# Patient Record
Sex: Female | Born: 1960 | Race: White | Hispanic: No | Marital: Married | Smoking: Never smoker
Health system: Southern US, Community
[De-identification: ages and names within clinical notes are randomized; demographics above are authoritative.]

## PROBLEM LIST (undated history)

## (undated) DIAGNOSIS — K222 Esophageal obstruction: Secondary | ICD-10-CM

## (undated) DIAGNOSIS — G43009 Migraine without aura, not intractable, without status migrainosus: Secondary | ICD-10-CM

## (undated) DIAGNOSIS — K219 Gastro-esophageal reflux disease without esophagitis: Secondary | ICD-10-CM

## (undated) DIAGNOSIS — I10 Essential (primary) hypertension: Secondary | ICD-10-CM

## (undated) DIAGNOSIS — K200 Eosinophilic esophagitis: Principal | ICD-10-CM

## (undated) DIAGNOSIS — I351 Nonrheumatic aortic (valve) insufficiency: Secondary | ICD-10-CM

## (undated) DIAGNOSIS — Z86018 Personal history of other benign neoplasm: Secondary | ICD-10-CM

## (undated) DIAGNOSIS — L309 Dermatitis, unspecified: Secondary | ICD-10-CM

## (undated) DIAGNOSIS — J45990 Exercise induced bronchospasm: Secondary | ICD-10-CM

## (undated) HISTORY — DX: Dermatitis, unspecified: L30.9

## (undated) HISTORY — PX: REDUCTION MAMMAPLASTY: SUR839

## (undated) HISTORY — DX: Nonrheumatic aortic (valve) insufficiency: I35.1

## (undated) HISTORY — DX: Migraine without aura, not intractable, without status migrainosus: G43.009

## (undated) HISTORY — DX: Eosinophilic esophagitis: K20.0

## (undated) HISTORY — DX: Gastro-esophageal reflux disease without esophagitis: K21.9

## (undated) HISTORY — DX: Esophageal obstruction: K22.2

## (undated) HISTORY — DX: Essential (primary) hypertension: I10

## (undated) HISTORY — DX: Exercise induced bronchospasm: J45.990

## (undated) HISTORY — DX: Personal history of other benign neoplasm: Z86.018

## (undated) HISTORY — PX: BREAST REDUCTION SURGERY: SHX8

## (undated) HISTORY — PX: OTHER SURGICAL HISTORY: SHX169

## (undated) HISTORY — PX: TONSILLECTOMY: SUR1361

---

## 1993-07-15 HISTORY — PX: MYOMECTOMY: SHX85

## 1998-05-17 ENCOUNTER — Encounter: Payer: Self-pay | Admitting: Internal Medicine

## 1998-05-17 ENCOUNTER — Ambulatory Visit (HOSPITAL_COMMUNITY): Admission: RE | Admit: 1998-05-17 | Discharge: 1998-05-17 | Payer: Self-pay | Admitting: Internal Medicine

## 1998-07-15 HISTORY — PX: ABDOMINOPLASTY: SUR9

## 2000-11-07 ENCOUNTER — Other Ambulatory Visit: Admission: RE | Admit: 2000-11-07 | Discharge: 2000-11-07 | Payer: Self-pay | Admitting: Obstetrics & Gynecology

## 2002-07-02 ENCOUNTER — Ambulatory Visit (HOSPITAL_COMMUNITY): Admission: RE | Admit: 2002-07-02 | Discharge: 2002-07-02 | Payer: Self-pay | Admitting: Internal Medicine

## 2002-07-02 ENCOUNTER — Encounter: Payer: Self-pay | Admitting: Internal Medicine

## 2002-12-20 ENCOUNTER — Other Ambulatory Visit: Admission: RE | Admit: 2002-12-20 | Discharge: 2002-12-20 | Payer: Self-pay | Admitting: Obstetrics & Gynecology

## 2003-12-14 ENCOUNTER — Ambulatory Visit (HOSPITAL_COMMUNITY): Admission: RE | Admit: 2003-12-14 | Discharge: 2003-12-14 | Payer: Self-pay | Admitting: Obstetrics and Gynecology

## 2004-01-06 ENCOUNTER — Ambulatory Visit (HOSPITAL_COMMUNITY): Admission: RE | Admit: 2004-01-06 | Discharge: 2004-01-06 | Payer: Self-pay | Admitting: Obstetrics and Gynecology

## 2004-01-06 ENCOUNTER — Ambulatory Visit (HOSPITAL_BASED_OUTPATIENT_CLINIC_OR_DEPARTMENT_OTHER): Admission: RE | Admit: 2004-01-06 | Discharge: 2004-01-06 | Payer: Self-pay | Admitting: Obstetrics and Gynecology

## 2004-01-06 HISTORY — PX: ABLATION: SHX5711

## 2004-03-28 ENCOUNTER — Other Ambulatory Visit: Admission: RE | Admit: 2004-03-28 | Discharge: 2004-03-28 | Payer: Self-pay | Admitting: Obstetrics and Gynecology

## 2005-05-15 DIAGNOSIS — G43009 Migraine without aura, not intractable, without status migrainosus: Secondary | ICD-10-CM

## 2005-05-15 HISTORY — DX: Migraine without aura, not intractable, without status migrainosus: G43.009

## 2005-05-28 ENCOUNTER — Other Ambulatory Visit: Admission: RE | Admit: 2005-05-28 | Discharge: 2005-05-28 | Payer: Self-pay | Admitting: Obstetrics and Gynecology

## 2005-06-25 ENCOUNTER — Ambulatory Visit (HOSPITAL_COMMUNITY): Admission: RE | Admit: 2005-06-25 | Discharge: 2005-06-25 | Payer: Self-pay | Admitting: Obstetrics and Gynecology

## 2006-10-20 ENCOUNTER — Ambulatory Visit: Payer: Self-pay | Admitting: Internal Medicine

## 2006-11-19 ENCOUNTER — Ambulatory Visit: Payer: Self-pay | Admitting: Internal Medicine

## 2006-11-19 LAB — CONVERTED CEMR LAB
AST: 13 units/L (ref 0–37)
Alkaline Phosphatase: 33 units/L — ABNORMAL LOW (ref 39–117)
Calcium: 9.9 mg/dL (ref 8.4–10.5)
Chloride: 108 meq/L (ref 96–112)
Cholesterol: 152 mg/dL (ref 0–200)
Creatinine, Ser: 0.7 mg/dL (ref 0.4–1.2)
GFR calc non Af Amer: 97 mL/min
HCT: 37.8 % (ref 36.0–46.0)
Hemoglobin: 13.6 g/dL (ref 12.0–15.0)
LDL Cholesterol: 97 mg/dL (ref 0–99)
MCHC: 35.9 g/dL (ref 30.0–36.0)
MCV: 85.1 fL (ref 78.0–100.0)
Monocytes Absolute: 0.4 10*3/uL (ref 0.2–0.7)
Neutro Abs: 2.8 10*3/uL (ref 1.4–7.7)
Neutrophils Relative %: 54.7 % (ref 43.0–77.0)
RDW: 11.9 % (ref 11.5–14.6)
TSH: 1.81 microintl units/mL (ref 0.35–5.50)
Total Bilirubin: 0.8 mg/dL (ref 0.3–1.2)
Total CHOL/HDL Ratio: 3.8
Triglycerides: 76 mg/dL (ref 0–149)
VLDL: 15 mg/dL (ref 0–40)
WBC: 5.1 10*3/uL (ref 4.5–10.5)

## 2006-12-04 ENCOUNTER — Ambulatory Visit: Payer: Self-pay | Admitting: Internal Medicine

## 2006-12-04 ENCOUNTER — Encounter: Payer: Self-pay | Admitting: Internal Medicine

## 2007-04-29 ENCOUNTER — Ambulatory Visit: Payer: Self-pay | Admitting: Critical Care Medicine

## 2007-04-29 ENCOUNTER — Ambulatory Visit: Payer: Self-pay | Admitting: Specialist

## 2007-05-27 ENCOUNTER — Ambulatory Visit: Payer: Self-pay | Admitting: Pulmonary Disease

## 2007-07-06 ENCOUNTER — Ambulatory Visit: Payer: Self-pay | Admitting: Critical Care Medicine

## 2007-08-01 ENCOUNTER — Emergency Department (HOSPITAL_COMMUNITY): Admission: EM | Admit: 2007-08-01 | Discharge: 2007-08-01 | Payer: Self-pay | Admitting: Emergency Medicine

## 2007-08-01 ENCOUNTER — Encounter: Payer: Self-pay | Admitting: Gastroenterology

## 2007-08-05 ENCOUNTER — Ambulatory Visit: Payer: Self-pay | Admitting: Gastroenterology

## 2007-08-14 ENCOUNTER — Ambulatory Visit: Payer: Self-pay | Admitting: Gastroenterology

## 2007-08-21 ENCOUNTER — Encounter: Payer: Self-pay | Admitting: Internal Medicine

## 2007-08-21 ENCOUNTER — Ambulatory Visit: Payer: Self-pay | Admitting: Gastroenterology

## 2007-08-21 ENCOUNTER — Encounter: Payer: Self-pay | Admitting: Gastroenterology

## 2007-08-26 ENCOUNTER — Encounter: Payer: Self-pay | Admitting: Internal Medicine

## 2007-09-01 ENCOUNTER — Other Ambulatory Visit: Admission: RE | Admit: 2007-09-01 | Discharge: 2007-09-01 | Payer: Self-pay | Admitting: Obstetrics and Gynecology

## 2007-09-09 ENCOUNTER — Ambulatory Visit: Payer: Self-pay | Admitting: Internal Medicine

## 2007-09-09 DIAGNOSIS — I10 Essential (primary) hypertension: Secondary | ICD-10-CM | POA: Insufficient documentation

## 2007-09-09 DIAGNOSIS — N318 Other neuromuscular dysfunction of bladder: Secondary | ICD-10-CM

## 2007-09-09 DIAGNOSIS — G43909 Migraine, unspecified, not intractable, without status migrainosus: Secondary | ICD-10-CM

## 2007-09-10 ENCOUNTER — Telehealth: Payer: Self-pay | Admitting: Internal Medicine

## 2007-10-05 ENCOUNTER — Ambulatory Visit: Payer: Self-pay | Admitting: Gastroenterology

## 2007-12-08 ENCOUNTER — Ambulatory Visit: Payer: Self-pay | Admitting: Internal Medicine

## 2007-12-08 LAB — CONVERTED CEMR LAB
Glucose, Urine, Semiquant: NEGATIVE
Specific Gravity, Urine: 1.02

## 2007-12-10 LAB — CONVERTED CEMR LAB
Alkaline Phosphatase: 39 units/L (ref 39–117)
Basophils Absolute: 0 10*3/uL (ref 0.0–0.1)
Basophils Relative: 0.7 % (ref 0.0–1.0)
Bilirubin, Direct: 0.1 mg/dL (ref 0.0–0.3)
Calcium: 9.4 mg/dL (ref 8.4–10.5)
Cholesterol: 168 mg/dL (ref 0–200)
Eosinophils Absolute: 0.5 10*3/uL (ref 0.0–0.7)
GFR calc Af Amer: 116 mL/min
GFR calc non Af Amer: 96 mL/min
Hemoglobin: 13.7 g/dL (ref 12.0–15.0)
LDL Cholesterol: 120 mg/dL — ABNORMAL HIGH (ref 0–99)
Lymphocytes Relative: 27.1 % (ref 12.0–46.0)
Monocytes Relative: 9.4 % (ref 3.0–12.0)
Neutro Abs: 2.9 10*3/uL (ref 1.4–7.7)
Total Bilirubin: 0.9 mg/dL (ref 0.3–1.2)
VLDL: 10 mg/dL (ref 0–40)

## 2007-12-15 ENCOUNTER — Ambulatory Visit: Payer: Self-pay | Admitting: Internal Medicine

## 2008-01-26 ENCOUNTER — Ambulatory Visit: Payer: Self-pay | Admitting: Internal Medicine

## 2008-01-26 ENCOUNTER — Ambulatory Visit: Payer: Self-pay | Admitting: Specialist

## 2008-02-29 ENCOUNTER — Encounter: Payer: Self-pay | Admitting: Internal Medicine

## 2008-06-23 ENCOUNTER — Telehealth: Payer: Self-pay | Admitting: Family Medicine

## 2008-07-18 ENCOUNTER — Telehealth: Payer: Self-pay | Admitting: Internal Medicine

## 2008-07-22 ENCOUNTER — Telehealth: Payer: Self-pay | Admitting: Gastroenterology

## 2008-07-22 ENCOUNTER — Encounter: Payer: Self-pay | Admitting: Gastroenterology

## 2008-07-27 ENCOUNTER — Telehealth: Payer: Self-pay | Admitting: Internal Medicine

## 2008-08-05 ENCOUNTER — Ambulatory Visit: Payer: Self-pay | Admitting: Internal Medicine

## 2008-08-11 ENCOUNTER — Encounter: Payer: Self-pay | Admitting: Internal Medicine

## 2008-08-15 ENCOUNTER — Telehealth: Payer: Self-pay | Admitting: *Deleted

## 2008-08-15 ENCOUNTER — Telehealth: Payer: Self-pay | Admitting: Internal Medicine

## 2008-09-20 ENCOUNTER — Telehealth: Payer: Self-pay | Admitting: *Deleted

## 2008-09-20 ENCOUNTER — Other Ambulatory Visit: Admission: RE | Admit: 2008-09-20 | Discharge: 2008-09-20 | Payer: Self-pay | Admitting: Obstetrics and Gynecology

## 2008-09-30 ENCOUNTER — Ambulatory Visit (HOSPITAL_COMMUNITY): Admission: RE | Admit: 2008-09-30 | Discharge: 2008-09-30 | Payer: Self-pay | Admitting: Obstetrics and Gynecology

## 2008-12-19 ENCOUNTER — Ambulatory Visit: Payer: Self-pay | Admitting: Internal Medicine

## 2008-12-19 LAB — CONVERTED CEMR LAB
ALT: 13 units/L (ref 0–35)
Albumin: 4.1 g/dL (ref 3.5–5.2)
Alkaline Phosphatase: 42 units/L (ref 39–117)
Basophils Absolute: 0 10*3/uL (ref 0.0–0.1)
Bilirubin Urine: NEGATIVE
Creatinine, Ser: 0.6 mg/dL (ref 0.4–1.2)
GFR calc non Af Amer: 114.02 mL/min (ref >60)
Glucose, Urine, Semiquant: NEGATIVE
Ketones, urine, test strip: NEGATIVE
LDL Cholesterol: 130 mg/dL — ABNORMAL HIGH (ref 0–99)
Lymphocytes Relative: 26.3 % (ref 12.0–46.0)
Lymphs Abs: 1.4 10*3/uL (ref 0.7–4.0)
MCV: 84.1 fL (ref 78.0–100.0)
Monocytes Absolute: 0.4 10*3/uL (ref 0.1–1.0)
Monocytes Relative: 7.9 % (ref 3.0–12.0)
Nitrite: NEGATIVE
Platelets: 272 10*3/uL (ref 150.0–400.0)
RBC: 4.65 M/uL (ref 3.87–5.11)
Sodium: 142 meq/L (ref 135–145)
Specific Gravity, Urine: 1.02
Total Protein: 6.9 g/dL (ref 6.0–8.3)
Urobilinogen, UA: 0.2
pH: 6.5

## 2009-01-10 ENCOUNTER — Ambulatory Visit: Payer: Self-pay | Admitting: Internal Medicine

## 2009-01-10 LAB — CONVERTED CEMR LAB
Blood in Urine, dipstick: NEGATIVE
Glucose, Urine, Semiquant: NEGATIVE
Ketones, urine, test strip: NEGATIVE
pH: 6

## 2009-01-11 ENCOUNTER — Encounter: Payer: Self-pay | Admitting: Internal Medicine

## 2009-01-17 ENCOUNTER — Telehealth (INDEPENDENT_AMBULATORY_CARE_PROVIDER_SITE_OTHER): Payer: Self-pay | Admitting: *Deleted

## 2009-05-08 ENCOUNTER — Telehealth (INDEPENDENT_AMBULATORY_CARE_PROVIDER_SITE_OTHER): Payer: Self-pay | Admitting: *Deleted

## 2009-05-29 ENCOUNTER — Telehealth: Payer: Self-pay | Admitting: *Deleted

## 2009-06-05 ENCOUNTER — Ambulatory Visit: Payer: Self-pay | Admitting: Internal Medicine

## 2009-06-16 ENCOUNTER — Encounter: Payer: Self-pay | Admitting: Internal Medicine

## 2009-06-16 ENCOUNTER — Telehealth: Payer: Self-pay | Admitting: *Deleted

## 2009-06-28 ENCOUNTER — Telehealth: Payer: Self-pay | Admitting: *Deleted

## 2009-06-28 ENCOUNTER — Encounter (INDEPENDENT_AMBULATORY_CARE_PROVIDER_SITE_OTHER): Payer: Self-pay | Admitting: *Deleted

## 2009-07-06 ENCOUNTER — Encounter: Payer: Self-pay | Admitting: Internal Medicine

## 2009-07-17 ENCOUNTER — Telehealth: Payer: Self-pay | Admitting: *Deleted

## 2009-10-02 ENCOUNTER — Ambulatory Visit (HOSPITAL_COMMUNITY): Admission: RE | Admit: 2009-10-02 | Discharge: 2009-10-02 | Payer: Self-pay | Admitting: Obstetrics and Gynecology

## 2009-10-07 IMAGING — CR DG CHEST 2V
2 series · 2 of 2 positions shown · non-contrast
Comparison: 04/29/07

CLINICAL DATA: Question of foreign body in esophagus.
 CHEST ? 2 VIEW:

[w chest pa]
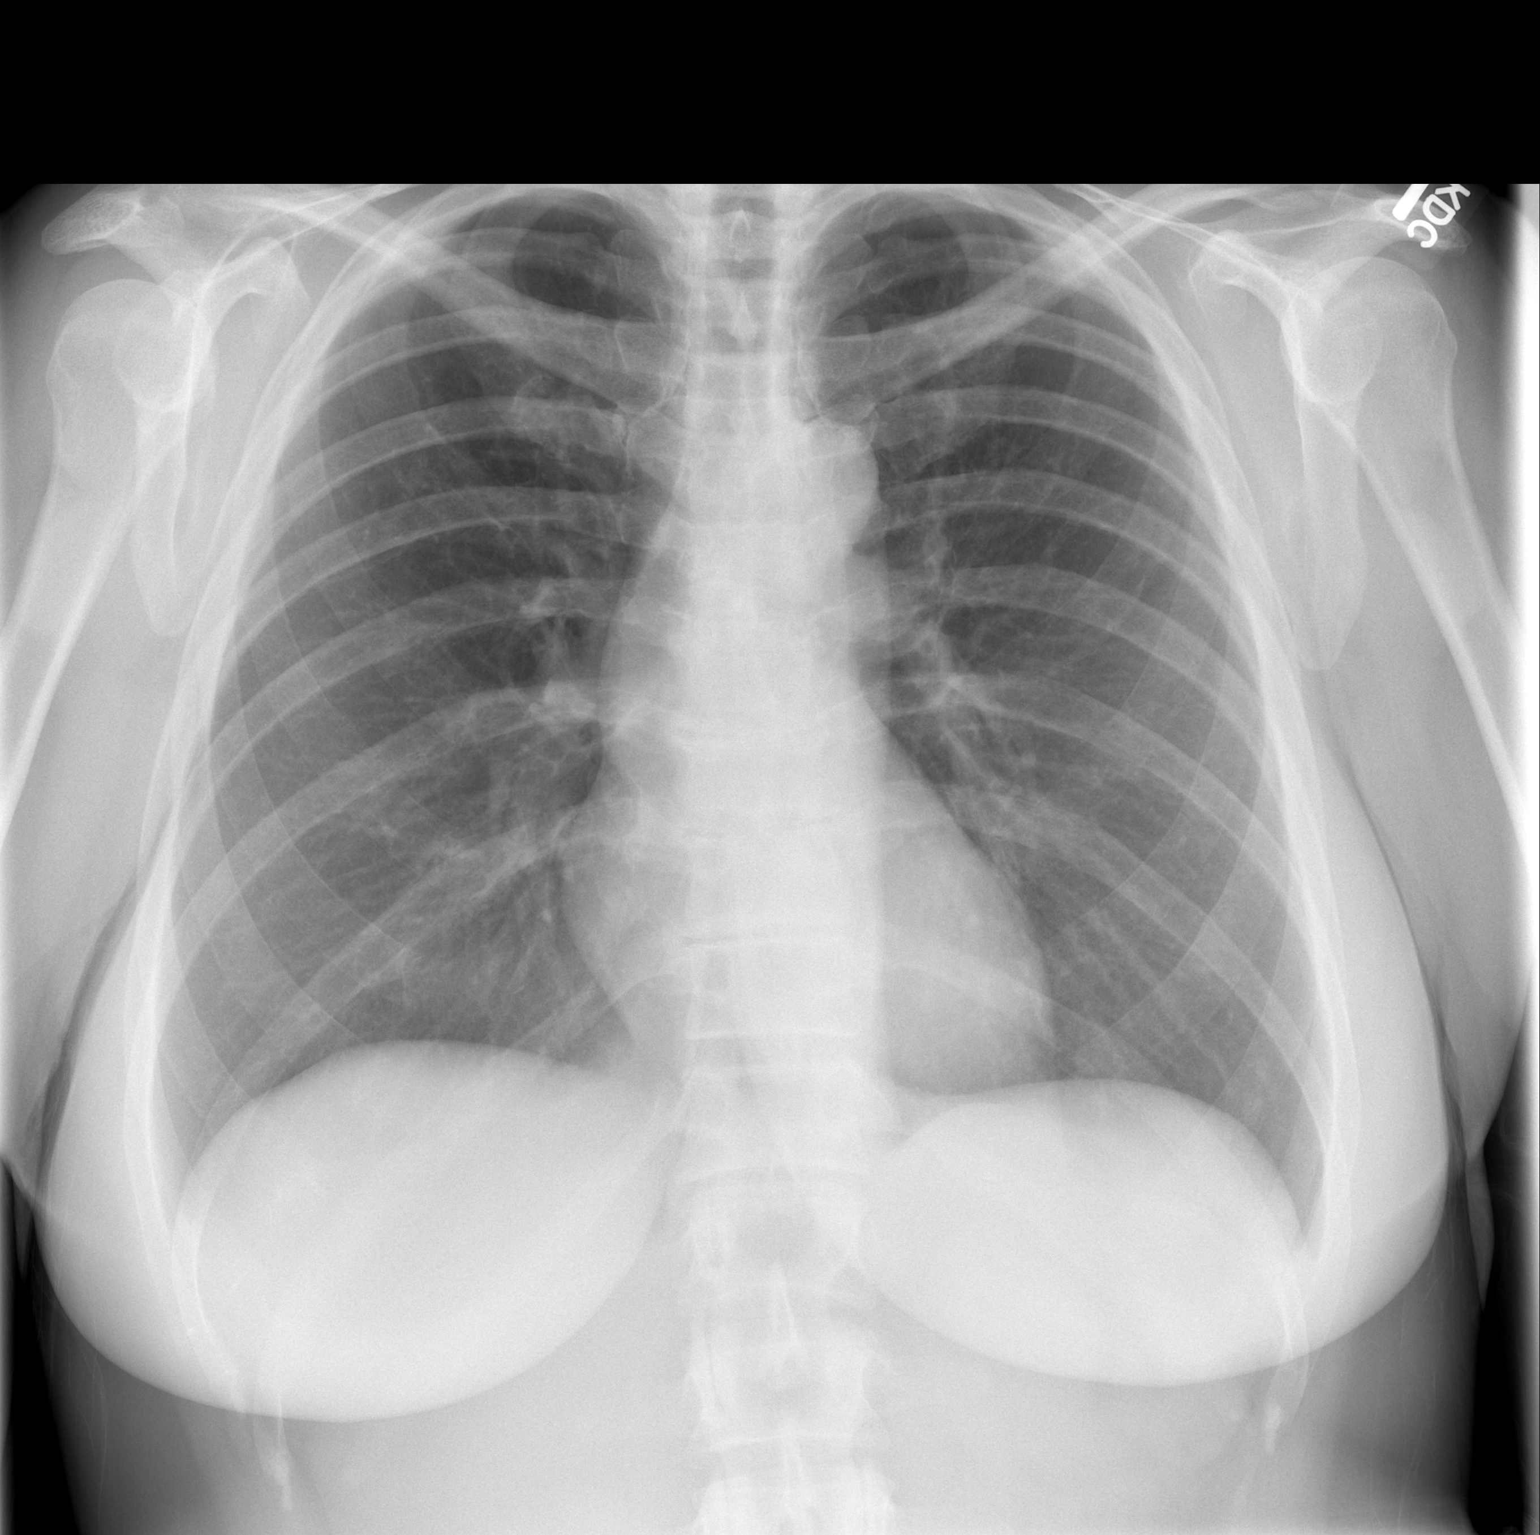

[w chest lat]
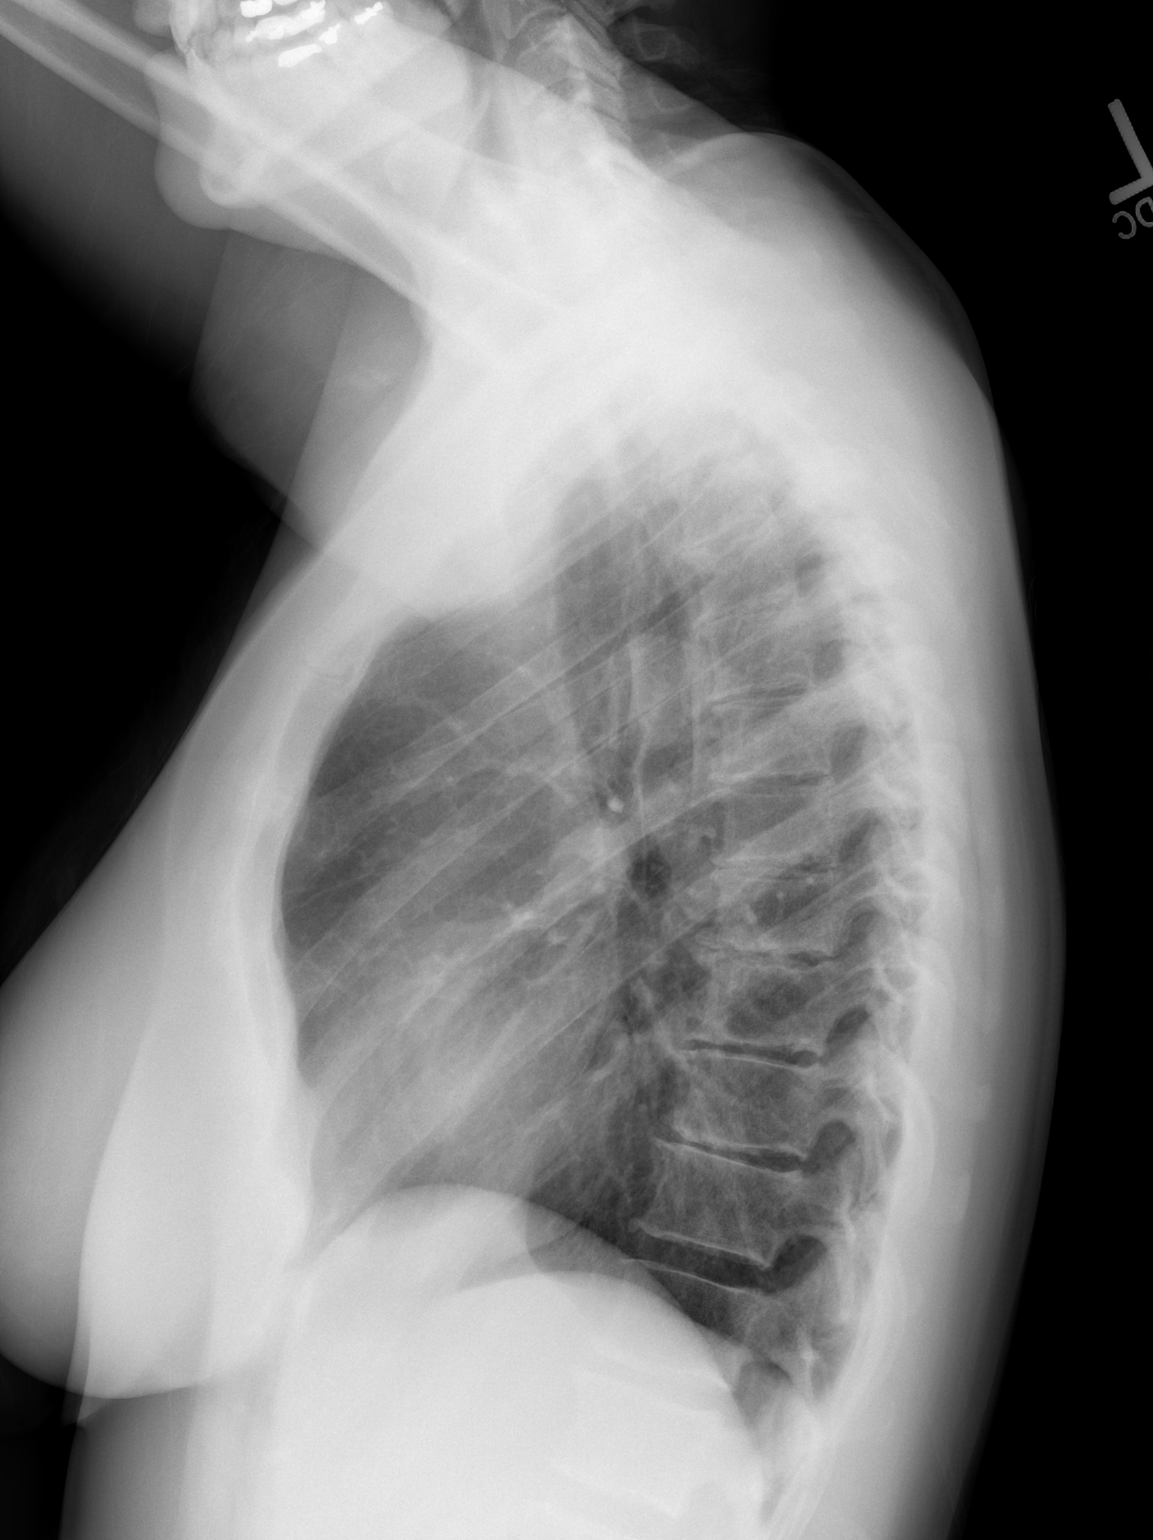

[2 of 2 positions shown; findings below may reference images not displayed]

FINDINGS: Two views of the chest show the lungs to be clear.  The heart is within normal limits in size.  No opaque foreign body is seen.
IMPRESSION: No active lung disease.  No opaque foreign body.

## 2010-05-14 ENCOUNTER — Ambulatory Visit: Payer: Self-pay | Admitting: Internal Medicine

## 2010-05-14 DIAGNOSIS — L301 Dyshidrosis [pompholyx]: Secondary | ICD-10-CM

## 2010-05-14 DIAGNOSIS — E669 Obesity, unspecified: Secondary | ICD-10-CM

## 2010-05-17 ENCOUNTER — Ambulatory Visit: Payer: Self-pay | Admitting: Internal Medicine

## 2010-05-17 LAB — CONVERTED CEMR LAB
Glucose, Urine, Semiquant: NEGATIVE
Nitrite: NEGATIVE
Protein, U semiquant: NEGATIVE
Specific Gravity, Urine: 1.025

## 2010-05-21 LAB — CONVERTED CEMR LAB
ALT: 14 units/L (ref 0–35)
AST: 18 units/L (ref 0–37)
Albumin: 4.2 g/dL (ref 3.5–5.2)
BUN: 16 mg/dL (ref 6–23)
Bilirubin, Direct: 0.1 mg/dL (ref 0.0–0.3)
CO2: 29 meq/L (ref 19–32)
Calcium: 9.8 mg/dL (ref 8.4–10.5)
Chloride: 106 meq/L (ref 96–112)
Cholesterol: 173 mg/dL (ref 0–200)
Creatinine, Ser: 0.8 mg/dL (ref 0.4–1.2)
GFR calc non Af Amer: 87.6 mL/min (ref >60)
Glucose, Bld: 82 mg/dL (ref 70–99)
HCT: 39.5 % (ref 36.0–46.0)
Lymphs Abs: 1.7 10*3/uL (ref 0.7–4.0)
MCV: 84.3 fL (ref 78.0–100.0)
Monocytes Absolute: 0.4 10*3/uL (ref 0.1–1.0)
Monocytes Relative: 7.7 % (ref 3.0–12.0)
Neutro Abs: 2.6 10*3/uL (ref 1.4–7.7)
Neutrophils Relative %: 50.3 % (ref 43.0–77.0)
Potassium: 4.3 meq/L (ref 3.5–5.1)
RDW: 13 % (ref 11.5–14.6)
Sodium: 140 meq/L (ref 135–145)
TSH: 1.91 microintl units/mL (ref 0.35–5.50)
Total Protein: 6.6 g/dL (ref 6.0–8.3)

## 2010-05-22 ENCOUNTER — Ambulatory Visit: Payer: Self-pay | Admitting: Internal Medicine

## 2010-05-23 ENCOUNTER — Encounter: Payer: Self-pay | Admitting: *Deleted

## 2010-05-23 LAB — CONVERTED CEMR LAB
Specific Gravity, Urine: 1.015 (ref 1.000–1.030)
Total Protein, Urine: NEGATIVE mg/dL
Urine Glucose: NEGATIVE mg/dL
Urobilinogen, UA: 0.2 (ref 0.0–1.0)

## 2010-05-25 ENCOUNTER — Telehealth: Payer: Self-pay | Admitting: Internal Medicine

## 2010-07-18 ENCOUNTER — Ambulatory Visit
Admission: RE | Admit: 2010-07-18 | Discharge: 2010-07-18 | Payer: Self-pay | Source: Home / Self Care | Attending: Cardiology | Admitting: Cardiology

## 2010-07-18 ENCOUNTER — Encounter: Payer: Self-pay | Admitting: Cardiology

## 2010-07-19 ENCOUNTER — Ambulatory Visit (HOSPITAL_COMMUNITY)
Admission: RE | Admit: 2010-07-19 | Discharge: 2010-07-19 | Payer: Self-pay | Source: Home / Self Care | Attending: Cardiology | Admitting: Cardiology

## 2010-07-19 ENCOUNTER — Ambulatory Visit: Admission: RE | Admit: 2010-07-19 | Discharge: 2010-07-19 | Payer: Self-pay | Source: Home / Self Care

## 2010-07-19 ENCOUNTER — Other Ambulatory Visit: Payer: Self-pay | Admitting: Cardiology

## 2010-08-06 ENCOUNTER — Telehealth: Payer: Self-pay | Admitting: *Deleted

## 2010-08-16 NOTE — Letter (Signed)
Summary: Generic Letter  [place] [facility]  [address] [place], [place] [address]   Phone: [phone]  Fax: [phone]    05/23/2010  Pershing Cox [address] [place], [place]  [address]  Dear Ms. Dethlefs,  We wanted to let you know that your urine was normal. If you have any questions, please give [place] a call at [phone].         Sincerely,   Tor Netters, CMA (AAMA)

## 2010-08-16 NOTE — Assessment & Plan Note (Signed)
Summary: consult re: meds/cjr   Vital Signs:  Patient profile:   50 year old female Menstrual status:  ablation Height:      62 inches Weight:      184 pounds BMI:     33.78 Pulse rate:   78 / minute BP sitting:   120 / 80  (left arm) Cuff size:   regular  Vitals Entered By: Romualdo Bolk, CMA (AAMA) (May 14, 2010 1:13 PM) CC: Discuss getting a referral to go to the cardiologist, discuss miagaine medication- zomig is working but would like to try something else; Discuss overactive bladder. Pt states that she is having a problem with this., Hypertension Management   History of Present Illness: Angelica Wells comes in today  for a number of issues  Her last visit was a year ago . Since that time no major changes in health and sees her Gyne for reg checks .  However she has gained some weight and struggling with this.    She has a number of concerns :  1.CV: discussed a few years ago of seeing cardiology .  she had an echo for a research study for Migraines and showed mild AI  no lv dysfunction.  Has had nor doe recently that could be from weight gain but her mom who had AVR  for Hoy Register  was concerned ir could be related tocardiac cause.  2.Her MHA are a bit bette and although zomig works best tried her moms 100mg  sumatriptan and helped some and is willing to try this for cost reasons   takes  Migraine meds  about  2-10  per month.   usuallly hormonal related.   3.GERD: on PPI    hx of stricture and Food impaction and  wsaa told she would need to be on this for the on  rest of life. Has concerns about potential side effects. 4.HT:  controlled hasnt had  labs done since done here 16 months ago.  5.Has urinary frequency and OAB .  ? meds   had GYne exn and not addressed.     Hypertension History:      She complains of headache, palpitations, and dyspnea with exertion, but denies chest pain, orthopnea, PND, peripheral edema, visual symptoms, neurologic problems, syncope, and  side effects from treatment.  She notes no problems with any antihypertensive medication side effects.        Positive major cardiovascular risk factors include hypertension.  Negative major cardiovascular risk factors include female age less than 32 years old and non-tobacco-user status.     Preventive Screening-Counseling & Management  Alcohol-Tobacco     Alcohol drinks/day: <1     Alcohol type: wine     Smoking Status: never  Caffeine-Diet-Exercise     Caffeine use/day: 1 cup of coffee in the morning     Does Patient Exercise: no  Current Medications (verified): 1)  Protonix 40 Mg  Tbec (Pantoprazole Sodium) .Marland Kitchen.. 1 By Mouth Once Daily 2)  Tenormin 100 Mg  Tabs (Atenolol) .Marland Kitchen.. 1 By Mouth Once Daily 3)  Lisinopril-Hydrochlorothiazide 10-12.5 Mg  Tabs (Lisinopril-Hydrochlorothiazide) .Marland Kitchen.. 1 By Mouth Once Daily 4)  Fish Oil   Oil (Fish Oil) 5)  Calcium 500/d 500-200 Mg-Unit  Tabs (Calcium Carbonate-Vitamin D) 6)  Vitamin D 1000 Unit  Tabs (Cholecalciferol) 7)  Multivitamins   Tabs (Multiple Vitamin) 8)  Zomig 5 Mg Tabs (Zolmitriptan) .Marland Kitchen.. 1-2 As Needed 9)  Zyrtec Allergy 10 Mg Tabs (Cetirizine Hcl)  Allergies (verified): 1)  !  Sulfa  Past History:  Past medical, surgical, family and social histories (including risk factors) reviewed, and no changes noted (except as noted below).  Past Medical History: Reviewed history from 01/10/2009 and no changes required. Esophageal Stricture GERD Hypertension  on med since age 20  G2P2   Consults None  Past Surgical History: Reviewed history from 09/09/2007 and no changes required. Breast reduction Uterine ablation Myomectomy C-Sections x 2  Past History:  Care Management: Gynecology: Romine Gastroenterology: Arlyce Dice  Family History: Reviewed history from 08/05/2008 and no changes required. mvp ? bicuspid aortic valve  mom cabg Family History Hypertension Father: Bladder Cancer Mother: GABG and valve replacement, HBP,     migraines and went away after menopause. Siblings: Healthy     Social History: Reviewed history from 01/10/2009 and no changes required. Married Never Smoked Alcohol use-yes  social .  Drug use-no  Regular exercise-no 2 caffiene  HH of 4    no pet  BS degree  workin substitute teaching ocassionally  Review of Systems  The patient denies anorexia, fever, weight loss, vision loss, decreased hearing, hoarseness, chest pain, syncope, peripheral edema, prolonged cough, abdominal pain, melena, hematochezia, severe indigestion/heartburn, hematuria, muscle weakness, depression, unusual weight change, enlarged lymph nodes, and angioedema.         cough for 2 weeks mild  dry , doesnt think from meds .   som DOE  felt from deconditioning  no cp   no edema  urinary frequncy  a lot and some urge incontinence .   has hand peeling and rash itchy given ointment byt Dr R   once daily no help .  adherance less cause of  greasiness  Physical Exam  General:  Well-developed,well-nourished,in no acute distress; alert,appropriate and cooperative throughout examination Head:  normocephalic and atraumatic.   Eyes:  vision grossly intact, pupils equal, and pupils round.   Ears:  R ear normal, L ear normal, and no external deformities.   Mouth:  good dentition and pharynx pink and moist.   Neck:  No deformities, masses, or tenderness noted. Lungs:  Normal respiratory effort, chest expands symmetrically. Lungs are clear to auscultation, no crackles or wheezes. Heart:  Normal rate and regular rhythm. S1 and S2 normal without gallop, murmur, click, rub or other extra sounds.no lifts.   dont hear murmur today  Abdomen:  Bowel sounds positive,abdomen soft and non-tender without masses, organomegaly or   noted. Msk:  no joint warmth and no redness over joints.   Pulses:  pulses intact without delay   Extremities:  no clubbing cyanosis or edema  Neurologic:  grossly non focal  Skin:  turgor normal, color  normal, no ecchymoses, and no petechiae.   right hand with scaling and peeling thumg and fingers  Cervical Nodes:  No lymphadenopathy noted Psych:  Oriented X3, normally interactive, good eye contact, not anxious appearing, and not depressed appearing.     Impression & Recommendations:  Problem # 1:  HYPERTENSION (ICD-401.9)  overdue for labs  apparently controlled  Her updated medication list for this problem includes:    Tenormin 100 Mg Tabs (Atenolol) .Marland Kitchen... 1 by mouth once daily    Lisinopril-hydrochlorothiazide 10-12.5 Mg Tabs (Lisinopril-hydrochlorothiazide) .Marland Kitchen... 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  mom had avr for ? bicuspid AV disease.   echo done 2008 Her updated medication list for this problem includes:    Tenormin 100 Mg Tabs (Atenolol) .Marland Kitchen... 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 call with name of ointment and will tr another med  Her updated medication list for this problem includes:    Zyrtec Allergy 10 Mg Tabs (Cetirizine hcl)  Problem # 4:  MIGRAINE HEADACHE (ICD-346.90) stable  reasonable to try generic and go back to zomig if a problem .   Her updated medication list for this problem includes:    Tenormin 100 Mg Tabs (Atenolol) .Marland Kitchen... 1 by mouth once daily    Zomig 5 Mg Tabs (Zolmitriptan) .Marland Kitchen... 1-2 as needed    Sumatriptan Succinate 100 Mg Tabs (Sumatriptan succinate) .Marland Kitchen... 1 by mouth as needed ha  can repeat in 2 hours  Problem # 5:  GERD  WITH HX OVSTRUCTION (ICD-530.81)  Her updated medication list for this problem includes:    Protonix 40 Mg Tbec (Pantoprazole sodium) .Marland Kitchen... 1 by mouth once daily  Problem # 6:  OBESITY (ICD-278.00) weight loss will help her medical problems    Ht: 62 (05/14/2010)   Wt: 184 (05/14/2010)   BMI: 33.78 (05/14/2010)  Problem # 7:  OVERACTIVE BLADDER (ICD-596.51) at present wait on medication for this .   Complete Medication List: 1)  Protonix 40 Mg Tbec (Pantoprazole sodium) .Marland Kitchen.. 1 by mouth once daily 2)  Tenormin 100 Mg Tabs (Atenolol) .Marland Kitchen.. 1 by mouth once daily 3)  Lisinopril-hydrochlorothiazide 10-12.5 Mg Tabs (Lisinopril-hydrochlorothiazide) .Marland Kitchen.. 1 by mouth once daily 4)  Fish Oil Oil (Fish oil) 5)  Calcium 500/d 500-200 Mg-unit Tabs (Calcium carbonate-vitamin d) 6)  Vitamin D 1000 Unit Tabs (Cholecalciferol) 7)  Multivitamins Tabs (Multiple vitamin) 8)  Zomig 5 Mg Tabs (Zolmitriptan) .Marland Kitchen.. 1-2 as needed 9)  Zyrtec Allergy 10 Mg Tabs (Cetirizine hcl) 10)  Sumatriptan Succinate 100 Mg Tabs (Sumatriptan succinate) .Marland Kitchen.. 1 by mouth as needed ha  can repeat in 2 hours  Hypertension Assessment/Plan:      The patient's hypertensive risk group is category A: No risk factors and no target organ damage.  Her calculated 10 year risk of coronary heart disease is 6 %.  Today's blood pressure is 120/80.    Patient Instructions: 1)  will   arrange  cardiology referral.  2)  will review record to check on old studies  3)  trial of generic 100mg  imitrex  for HA  4)  Schedule fasting labs   cpx dx  401.9 and v70.0 , Mg . 5)  would hold off on urine meds until other evaluation.  6)  You will be informed of lab results when available.  then plan follow up.  7)  Call with name of cream for hand eczema Prescriptions: SUMATRIPTAN SUCCINATE 100 MG TABS (SUMATRIPTAN SUCCINATE) 1 by mouth as needed HA  can repeat in 2 hours  #27 x 0   Entered and Authorized by:   Madelin Headings MD   Signed by:   Madelin Headings MD on 05/14/2010   Method used:   Electronically to  [facility]* (retail)             ,          Ph: [phone]       Fax: [phone]   RxID:   [id number]    Orders Added: 1)  Cardiology Referral [Cardiology] 2)  Est. Patient Level IV [56387]  Appended Document: consult re: meds/cjr Medications Added BETAMETHASONE VALERATE 0.1 % OINT (BETAMETHASONE VALERATE)           Clinical Lists Changes  Medications: Added new medication of BETAMETHASONE VALERATE 0.1 % OINT (BETAMETHASONE VALERATE) - Signed

## 2010-08-16 NOTE — Progress Notes (Signed)
Summary: cardio referral  Phone Note Call from Patient   Caller: Patient Call For: Madelin Headings MD Reason for Call: Talk to Doctor Summary of Call: patient is calling to check on her referral to cardiology. Initial call taken by: Kern Reap CMA Duncan Dull),  May 25, 2010 9:26 AM  Follow-up for Phone Call        was sent   please check in to this Follow-up by: Madelin Headings MD,  May 25, 2010 9:30 AM  Additional Follow-up for Phone Call Additional follow up Details #1::        I called [facility] and spoke with Rhunette Croft to check the status of this referral.  She said it is still under review by Dr. Gala Romney.  His nurse, Herbert Seta is out and Rhunette Croft will follow up with them on Monday, 11/14 - then get back to me with decision.  I will call pt to inform of process. Additional Follow-up by: Corky Mull,  May 25, 2010 11:23 AM    Additional Follow-up for Phone Call Additional follow up Details #2::    patient is aware Follow-up by: Kern Reap CMA Duncan Dull),  May 25, 2010 11:47 AM

## 2010-08-16 NOTE — Progress Notes (Signed)
  Phone Note Call from Patient Call back at Home Phone [phone]   Caller: Patient Summary of Call: Pt wants a rx called in for 30 pills on z[facility]ig to [facility]. Initial call taken by: Romualdo Bolk, CMA (AAMA),  July 17, 2009 5:24 PM  Follow-up for Phone Call        Pt aware that rx was sent on 06/28/2009 Follow-up by: Romualdo Bolk, CMA (AAMA),  July 17, 2009 5:26 PM     Appended Document:   Pt called back saying that we need to send in another rx due to [facility] stopping rx. Rx sent electronically. Romualdo Bolk, CMA (AAMA)  July 18, 2009 5:20 PM   Clinical Lists Changes  Medications: Rx of ZOMIG 5 MG TABS (ZOLMITRIPTAN) 1-2 as needed;  #30 x 1;  Signed;  Entered by: Romualdo Bolk, CMA (AAMA);  Authorized by: Madelin Headings MD;  Method used: Electronically to [facility]*, , ,   , Ph: [phone], Fax: [phone]    Prescriptions: ZOMIG 5 MG TABS (ZOLMITRIPTAN) 1-2 as needed  #30 x 1   Entered by:   Romualdo Bolk, CMA (AAMA)   Authorized by:   Madelin Headings MD   Signed by:   Romualdo Bolk, CMA (AAMA) on 07/18/2009   Method used:   Electronically to        [facility]* (mail-order)             ,          Ph: [phone]       Fax: [phone]   RxID:   [id number]    Appended Document:  recieved a message saying that rx didn't go thru electronically. So I sent it via faxed rx. Romualdo Bolk, CMA (AAMA)  July 19, 2009 8:41 AM     Clinical Lists Changes  Medications: Rx of ZOMIG 5 MG TABS (ZOLMITRIPTAN) 1-2 as needed;  #30 x 1;  Signed;  Entered by: Romualdo Bolk, CMA (AAMA);  Authorized by: Madelin Headings MD;  Method used: Faxed to [facility]*, , ,   , Ph: [phone], Fax: [phone]    Prescriptions: ZOMIG 5 MG TABS (ZOLMITRIPTAN) 1-2 as needed  #30 x 1   Entered by:   Romualdo Bolk, CMA (AAMA)   Authorized by:   Madelin Headings MD   Signed by:   Romualdo Bolk, CMA (AAMA) on  07/19/2009   Method used:   Faxed to ...       [facility]* (mail-order)             ,          Ph: [phone]       Fax: [phone]   RxID:   [id number]

## 2010-08-16 NOTE — Assessment & Plan Note (Signed)
Summary: np6/aorta reguritation pt [facility]  Medications Added FISH OIL   OIL (FISH OIL) 1 tab by mouth once daily CALCIUM 500/D 500-200 MG-UNIT  TABS (CALCIUM CARBONATE-VITAMIN D) 1 tab by mouth once daily VITAMIN D 1000 UNIT  TABS (CHOLECALCIFEROL) 1 tab by mouth once daily ZYRTEC ALLERGY 10 MG TABS (CETIRIZINE HCL) as needed * MEGA RED 1  tab by mouth once daily ASPIRIN 81 MG  TABS (ASPIRIN) 1 tab po qd        CC:  dizziness.  History of Present Illness: 50 year old female for evaluation of aortic insufficiency. Apparently her mother had an aortic valve replacement previously. An echocardiogram performed in approximatedly 2009 previously for a migraine study showed mild aortic insufficiency but I do not have those records available. Sstates she has dyspnea with more extreme activities but not with routine activities. There is no orthopnea, PND, pedal edema, syncope or chest pain. She occasionally feels brief palpitations but not sustained. They are described as a "skip".  Current Medications (verified): 1)  Protonix 40 Mg  Tbec (Pantoprazole Sodium) .Marland Kitchen.. 1 By Mouth Once Daily 2)  Tenormin 100 Mg  Tabs (Atenolol) .Marland Kitchen.. 1 By Mouth Once Daily 3)  Lisinopril-Hydrochlorothiazide 10-12.5 Mg  Tabs (Lisinopril-Hydrochlorothiazide) .Marland Kitchen.. 1 By Mouth Once Daily 4)  Fish Oil   Oil (Fish Oil) .Marland Kitchen.. 1 Tab By Mouth Once Daily 5)  Calcium 500/d 500-200 Mg-Unit  Tabs (Calcium Carbonate-Vitamin D) .Marland Kitchen.. 1 Tab By Mouth Once Daily 6)  Vitamin D 1000 Unit  Tabs (Cholecalciferol) .Marland Kitchen.. 1 Tab By Mouth Once Daily 7)  Zomig 5 Mg Tabs (Zolmitriptan) .Marland Kitchen.. 1-2 As Needed 8)  Zyrtec Allergy 10 Mg Tabs (Cetirizine Hcl) .... As Needed 9)  Fluocinonide 0.05 % Oint (Fluocinonide) .... Can Use Two Times A Day or Hs For 2-4 Weeks 10)  Mega Red .Marland Kitchen.. 1  Tab By Mouth Once Daily 11)  Aspirin 81 Mg  Tabs (Aspirin) .Marland Kitchen.. 1 Tab Po Qd  Allergies: 1)  ! Sulfa  Past History:  Past Medical History: Hypertension AORTIC  REGURGITATION GERD MIGRAINE HEADACHE  Esophageal Stricture G2P2  Past Surgical History: Breast reduction Uterine ablation Myomectomy C-Sections x 2 Tonsillectomy  Family History: Reviewed history from 05/14/2010 and no changes required. mvp ? bicuspid aortic valve  mom cabg Family History Hypertension Father: Bladder Cancer Mother: GABG and valve replacement, HBP,    migraines and went away after menopause. Siblings: Healthy     Social History: Reviewed history from 05/14/2010 and no changes required. Married Never Smoked Alcohol use-yes  social Drug use-no  Regular exercise-no 2 caffiene  HH of 4    no pet  BS degree  workin substitute teaching ocassionally  Review of Systems       no fevers or chills, productive cough, hemoptysis, dysphasia, odynophagia, melena, hematochezia, dysuria, hematuria, rash, seizure activity, orthopnea, PND, pedal edema, claudication. Remaining systems are negative.   Vital Signs:  Patient profile:   50 year old female Menstrual status:  ablation Height:      62 inches Weight:      186 pounds BMI:     34.14 Pulse rate:   61 / minute Resp:     14 per minute BP sitting:   130 / 80  (right arm)  Vitals Entered By: Kem Parkinson (July 18, 2010 10:47 AM)  Physical Exam  General:  Well developed/well nourished in NAD Skin warm/dry Patient not depressed No peripheral clubbing Back-normal HEENT-normal/normal eyelids Neck supple/normal carotid upstroke bilaterally; no bruits; no JVD;  no thyromegaly chest - CTA/ normal expansion CV - RRR/normal S1 and S2; no  rubs or gallops;  PMI nondisplaced; 1/6 systolic ejection murmur. Abdomen -NT/ND, no HSM, no mass, + bowel sounds, no bruit 2+ femoral pulses, no bruits Ext-no edema, chords, 2+ DP Neuro-grossly nonfocal     EKG  Procedure date:  07/18/2010  Findings:      Normal sinus rhythm at a rate of 61. No ST changes  Impression & Recommendations:  Problem # 1:  AORTIC  REGURGITATION, MILD BY ECHO (ICD-424.1) No symptoms. Plan schedule echocardiogram to assess aortic valve morphology and degree of aortic insufficiency. Her updated medication list for this problem includes:    Tenormin 100 Mg Tabs (Atenolol) .Marland Kitchen... 1 by mouth once daily    Lisinopril-hydrochlorothiazide 10-12.5 Mg Tabs (Lisinopril-hydrochlorothiazide) .Marland Kitchen... 1 by mouth once daily  Orders: Echocardiogram (Echo)  Problem # 2:  HYPERTENSION (ICD-401.9) Blood pressure controlled on present medications. Will continue. Her updated medication list for this problem includes:    Tenormin 100 Mg Tabs (Atenolol) .Marland Kitchen... 1 by mouth once daily    Lisinopril-hydrochlorothiazide 10-12.5 Mg Tabs (Lisinopril-hydrochlorothiazide) .Marland Kitchen... 1 by mouth once daily    Aspirin 81 Mg Tabs (Aspirin) .Marland Kitchen... 1 tab po qd  Problem # 3:  MIGRAINE HEADACHE (ICD-346.90)  The following medications were removed from the medication list:    Sumatriptan Succinate 100 Mg Tabs (Sumatriptan succinate) .Marland Kitchen... 1 by mouth as needed ha  can repeat in 2 hours Her updated medication list for this problem includes:    Tenormin 100 Mg Tabs (Atenolol) .Marland Kitchen... 1 by mouth once daily    Zomig 5 Mg Tabs (Zolmitriptan) .Marland Kitchen... 1-2 as needed    Aspirin 81 Mg Tabs (Aspirin) .Marland Kitchen... 1 tab po qd  Patient Instructions: 1)  Your physician has requested that you have an echocardiogram.  Echocardiography is a painless test that uses sound waves to create images of your heart. It provides your doctor with information about the size and shape of your heart and how well your heart's chambers and valves are working.  This procedure takes approximately one hour. There are no restrictions for this procedure. 2)  Your physician wants you to follow-up in: TWO YEARS  You will receive a reminder letter in the mail two months in advance. If you don't receive a letter, please call our office to schedule the follow-up appointment.

## 2010-08-16 NOTE — Progress Notes (Signed)
Summary: refill on zomig  Phone Note Call from Patient Call back at Home Phone [phone]   Caller: Patient Summary of Call: Pt needs a refill on zomig Initial call taken by: Romualdo Bolk, CMA (AAMA),  August 06, 2010 11:47 AM  Follow-up for Phone Call        ok to refill x 1  Follow-up by: Madelin Headings MD,  August 06, 2010 1:40 PM  Additional Follow-up for Phone Call Additional follow up Details #1::        Rx sent to pharmacy Additional Follow-up by: Romualdo Bolk, CMA Duncan Dull),  August 06, 2010 1:47 PM    Prescriptions: ZOMIG 5 MG TABS (ZOLMITRIPTAN) 1-2 as needed  #30 x 0   Entered by:   Romualdo Bolk, CMA (AAMA)   Authorized by:   Madelin Headings MD   Signed by:   Romualdo Bolk, CMA (AAMA) on 08/06/2010   Method used:   Electronically to        [facility]* (retail)             ,          Ph: [phone]       Fax: [phone]   RxID:   [id number]

## 2010-08-17 NOTE — Medication Information (Signed)
Summary: Approval for Zomig/[facility]  Approval for Zomig/[facility]   Imported By: Maryln Gottron 07/24/2009 14:13:18  _____________________________________________________________________  External Attachment:    Type:   Image     Comment:   External Document

## 2010-10-04 ENCOUNTER — Other Ambulatory Visit: Payer: Self-pay | Admitting: Internal Medicine

## 2010-11-27 NOTE — Assessment & Plan Note (Signed)
[facility]                         GASTROENTEROLOGY OFFICE NOTE   [name], [name]                      MRN:          [id number]  DATE:10/05/2007                            DOB:          [birth year]    PROBLEM:  Esophageal stricture.   HISTORY OF PRESENT ILLNESS:  Ms. Angelica Wells has returned following upper  endoscopy and esophageal dilatation. She has a history of solid food  impactions, which have resolved spontaneously. At endoscopy on August 21, 2007, the distal esophageal stricture was dilated to 18 mm. Biopsies  were taken because of an endoscopic suspicion for eosinophilic  esophagitis. Biopsies did demonstrate an increased number of  eosinophils, however, the final report indicates that this is more  likely due to reflux than eosinophilic esophagitis. She remains on  Nexium and now is entirely symptom-free.   PAST MEDICAL HISTORY:  1. Hypertension.  2. Migraine headaches.   FAMILY HISTORY:  Noncontributory.   MEDICATIONS:  Include Nexium, Tenormin, __________ , baby aspirin, and  Detrol LA.   ALLERGIES:  NO KNOWN DRUG ALLERGIES.   SOCIAL HISTORY:  She does not smoke. Drinks rarely. 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   REVIEW OF SYSTEMS:  Negative.   PHYSICAL EXAMINATION:  VITAL SIGNS:  Pulse 64, blood pressure 100/74,  weight 169.  HEENT:  EO MI.  PERRLA.  Sclerae are anicteric.  Conjunctivae are pink.  NECK:  Supple without thyromegaly, adenopathy or carotid bruits.  CHEST:  Clear to auscultation and percussion without adventitious  sounds.  CARDIAC:  Regular rhythm; normal S1 S2.  There are no murmurs, gallops  or rubs.  ABDOMEN:  Bowel sounds are normoactive.  Abdomen is soft, nontender and  nondistended.  There are no abdominal masses, tenderness, splenic  enlargement or hepatomegaly.  EXTREMITIES:  Full range of motion.  No cyanosis, clubbing or edema.  RECTAL:  Deferred.   IMPRESSION:  1. Esophageal  stricture, asymptomatic following dilatation therapy.  2. Esophagitis. There is a question of eosinophilic esophagitis by      endoscopy and pathologic reports. At this time, since she is      asymptomatic, I would not treat her any differently.   RECOMMENDATION:  1. Switch from Nexium to Protonix because of cost considerations.  2. Followup dilatation as needed.     Barbette Hair. Arlyce Dice, MD,FACG  Electronically Signed    RDK/MedQ  DD: 10/05/2007  DT: 10/05/2007  Job #: [id number]   cc:   Neta Mends. Fabian Sharp, MD

## 2010-11-30 NOTE — Assessment & Plan Note (Signed)
[facility] OFFICE NOTE   Angelica Wells, Angelica Wells                      MRN:          [id number]  DATE:10/20/2006                            DOB:          [birth year]    CHIEF COMPLAINT:  New patient to establish, has hypertension.   HISTORY OF PRESENT ILLNESS:  Angelica Wells is a 50 year old married  nonsmoking white female who comes in today for a first-time visit.  Her  GYN is Dr. Meredeth Ide and her previous primary care physician Dr.  Doristine Counter in [place].  She has had a diagnosis of hypertension since  about age 75 years of age, has been controlled on medication.  Currently  on lisinopril 12.5 mg and Tenormin 100 mg a day.  She states that she  has generally done well and has had no symptom of cardiovascular or  pulmonary disease.  Most recently her mother was diagnosed with coronary  disease and a valve problem which sounds like bicuspid aortic valve and  is due next week to get a valve replacement and CABG x3.  She wonders if  she is at risk for these particular problems also.  She also has a  history of migraine headaches and seems to be hormonally triggered for  which she takes p.r.n. Zomig.   PAST MEDICAL HISTORY:  See database.  Surgery:  Uterine ablation in  2004, two C-sections.  She is a gravida 2, para 2.  Last Pap December  2007.  Up-to-date on her mammogram and tetanus but does not know the  date.  She has seasonal rhinitis, GERD for which she takes Zantac,  headaches as above, and hypertension.   FAMILY HISTORY:  Father a new-onset hypertension in his 34s under  treatment.  Mother as above, what sounds like a bypass with aortic valve  needing replacement and CABG.  There is a relative with breast cancer.  Otherwise, negative for diabetes and otherwise noncontributory.   MEDICATIONS:  1. Tenormin 100 mg a day.  2. Lisinopril 12.5 a day.  3. Zomig 5 mg.  4. Zantac p.r.n.   DRUG ALLERGIES:   None known.   SOCIAL HISTORY:  Household of four.  Seven hours of sleep a night.  Has  a college degree.  Occasional alcohol, no tobacco.  No recent exercise.  Has gained 30 pounds over the last 3 years because of what she feels is  inactivity.   REVIEW OF SYSTEMS:  Negative for chest pain, shortness of breath, the  rest as per HPI.   OBJECTIVE:  VITAL SIGNS:  Height 5 feet 2 inches, weight 160, pulse 60  and regular, blood pressure 120/80.  GENERAL:  This is a WD/WN, healthy-appearing middle-aged lady in no  acute distress.  HEENT:  Grossly normal.  NECK:  Supple without mass, thyromegaly, or bruit.  CHEST:  CTA, BS equal.  CARDIAC:  S1, S2.  No gallops or murmurs heard today.  Peripheral pulses  present__________ CCE, no edema is noted.  ABDOMEN:  Soft without organomegaly, guarding or rebound.   EKG shows normal sinus rhythm with a  rate of 58, normal intervals.   IMPRESSION:  1. Hypertension, apparently well controlled, presumed essential      primary.  2. Family history of coronary disease and valve disease.  She will be      going to [place] to help with her mother's care and will find      out more information about her status.  Will get records of      previous recent lab work done for me to review and plan other labs      as per risk assessment, and then follow up after that as      appropriate or as needed in 1-2 months.     Neta Mends. Panosh, MD  Electronically Signed    WKP/MedQ  DD: 10/20/2006  DT: 10/21/2006  Job #: [id number]

## 2010-11-30 NOTE — Op Note (Signed)
NAMESHALA, BAUMBACH                         ACCOUNT NO.:  [id number]   MEDICAL RECORD NO.:  [id number]                   PATIENT TYPE:  AMB   LOCATION:  [facility]                                 FACILITY:  [facility]   PHYSICIAN:  Cynthia P. Romine, M.D.             DATE OF BIRTH:  Sep 17, 1960   DATE OF PROCEDURE:  01/06/2004  DATE OF DISCHARGE:                                 OPERATIVE REPORT   PREOPERATIVE DIAGNOSES:  1. Menorrhagia.  2. Known intramural fibroids, desire to avoid hysterectomy.   POSTOPERATIVE DIAGNOSES:  1. Menorrhagia.  2. Known intramural fibroids, desire to avoid hysterectomy.   PROCEDURES:  Endometrial ablation using the HydroTherm Ablator.   SURGEON:  Dr. Lorretta Harp   ANESTHESIA:  General endotracheal.   ESTIMATED BLOOD LOSS:  Minimal.   COMPLICATIONS:  None.   DESCRIPTION OF PROCEDURE:  The patient was taken to the operating room, and  general endotracheal anesthesia was introduced rather than a general by LMA  because the patient admitted to eating 3 spoonfuls of yogurt earlier in the  morning.  After general endotracheal anesthesia was introduced, the patient  was placed in the dorsal lithotomy position and prepped and draped in the  usual fashion.  Her bladder was drained with a red rubber catheter.  A  posterior weighted and anterior Simms retractors were placed; the cervix was  grabbed on its posterior lip because the uterus was known to be retroflexed.  The uterus was sounded to 8.5 cm.  A second tenaculum was put on the  anterior lip to straighten the axis of the uterus out as much as possible.  Pratt dilators were used to dilate to a [employer].  The hysteroscope was  introduced.  Photographic documentation was taken of the endometrial cavity  before the surgery, documenting the ostia.  Endometrial ablation was then  carried out using the HydroTherm Ablator.  Early into the heating procedure,  the unit shut down indicating a fluid loss of 10 mL at  a rate of  approximately 1 mL/m.  Procedure was stopped.  Cool fluid was circulated,  and the cavity was carefully explored.  No reason for the loss of fluid was  found.  There certainly were no perforations.  The scope was repositioned at  the internal os, and it was felt that possibly the fluid loss was because of  a slight movement in the hysteroscope; maybe the surgeon pulled it back  slightly, and that allowed for what was felt to be a fluid loss.  The  procedure continued using the heated fluid at this point, and the fluid  level held steady throughout the heating procedure until there was  approximately 1 minute left in the procedure, and it again indicated a 10 mL  fluid loss.  The heating fluid stopped.  Again, the cavity was explored and  felt to be intact, and it was felt that the cavity may  have expanded as the  uterine fibroid that was noted, shrank.  Therefore the unit was restarted,  and the last minute of the heating phase was accomplished with no further  loss of fluid.  At the end of the 10 minutes, cool fluid was circulated  again; the cavity was intact.  The fibroid that  was noted preop had shrunk considerably in size.  Photographic documentation  was taken, and the scope was removed.  Instruments were removed from the  vagina, and the patient was taken to the recovery room.  She tolerated the  procedure well.                                               Cynthia P. Romine, M.D.    CPR/MEDQ  D:  01/06/2004  T:  01/06/2004  Job:  [id number]

## 2011-01-02 ENCOUNTER — Other Ambulatory Visit: Payer: Self-pay | Admitting: Internal Medicine

## 2011-02-07 ENCOUNTER — Other Ambulatory Visit: Payer: Self-pay | Admitting: Internal Medicine

## 2011-02-18 ENCOUNTER — Other Ambulatory Visit: Payer: Self-pay | Admitting: Gastroenterology

## 2011-03-10 ENCOUNTER — Other Ambulatory Visit: Payer: Self-pay | Admitting: Internal Medicine

## 2011-04-05 LAB — I-STAT 8, (EC8 V) (CONVERTED LAB)
Bicarbonate: 23.2
Chloride: 106
Operator id: 294521
Potassium: 3.3 — ABNORMAL LOW
Sodium: 140
pCO2, Ven: 42.3 — ABNORMAL LOW

## 2011-06-08 ENCOUNTER — Other Ambulatory Visit: Payer: Self-pay | Admitting: Internal Medicine

## 2011-06-19 ENCOUNTER — Encounter: Payer: Self-pay | Admitting: Internal Medicine

## 2011-06-20 ENCOUNTER — Ambulatory Visit (INDEPENDENT_AMBULATORY_CARE_PROVIDER_SITE_OTHER): Payer: 59 | Admitting: Internal Medicine

## 2011-06-20 ENCOUNTER — Encounter: Payer: Self-pay | Admitting: Internal Medicine

## 2011-06-20 VITALS — BP 120/80 | HR 64 | Temp 98.8°F | Wt 192.0 lb

## 2011-06-20 DIAGNOSIS — R05 Cough: Secondary | ICD-10-CM

## 2011-06-20 DIAGNOSIS — G43909 Migraine, unspecified, not intractable, without status migrainosus: Secondary | ICD-10-CM

## 2011-06-20 DIAGNOSIS — I10 Essential (primary) hypertension: Secondary | ICD-10-CM

## 2011-06-20 DIAGNOSIS — J45990 Exercise induced bronchospasm: Secondary | ICD-10-CM

## 2011-06-20 DIAGNOSIS — E669 Obesity, unspecified: Secondary | ICD-10-CM

## 2011-06-20 DIAGNOSIS — K219 Gastro-esophageal reflux disease without esophagitis: Secondary | ICD-10-CM

## 2011-06-20 MED ORDER — LOSARTAN POTASSIUM-HCTZ 50-12.5 MG PO TABS: 1.0000 | ORAL_TABLET | Freq: Every day | ORAL | Status: DC

## 2011-06-20 NOTE — Patient Instructions (Signed)
It is possible  that cough is from the BP medication. Change medication and then Labs and check up in 1 month.  Call insurance co  About migraine medication formulary .

## 2011-06-20 NOTE — Progress Notes (Signed)
  Subjective:    Patient ID: Angelica Wells, female    DOB: 10/12/1960, 51 y.o.   MRN: [id number]  HPI Comes in for new problem evaluation. One month of cough ongoing  tickle like cough   Feels like wheezing comes and goes. No cp sob fever sweats or illness with this On acei for 10 years .  Hx of EIA. No rx    Allergies mostly  fall  Takes zyrtec and  withuri infection  Has had  Flu shot .   Review of Systems GERD some breaththroughh , gaining weight and asks about rx and appetite suppressants HAs ok with zomig but formulary issues  No NVD  NO injury new rashes  Rest as per hpi  Past history family history social history reviewed in the electronic medical record.     Objective:   Physical Exam WDWN in nad  HEENT: Normocephalic ;atraumatic , Eyes;  PERRL, EOMs  Full, lids and conjunctiva clear,,Ears: no deformities, canals nl, TM landmarks normal, Nose: no deformity or discharge  Mouth : OP clear without lesion or edema . Neck: Supple without adenopathy or masses or bruits Chest:  Clear to A&P without wheezes rales or rhonchi CV:  S1-S2 no gallops or murmurs peripheral perfusion is normal Ocass dry cough  Abdomen:  Sof,t normal bowel sounds without hepatosplenomegaly, no guarding rebound or masses no CVA tenderness No clubbing cyanosis or edema Skin: normal capillary refill ,turgor , color: No acute rashes ,petechiae or bruising Wt Readings from Last 3 Encounters:  06/20/11 192 lb (87.091 kg)  07/18/10 186 lb (84.369 kg)  05/14/10 184 lb (83.462 kg)     Last labs nov 2011     Assessment & Plan:  Cough new onset but acts like acei cough  Underlying eia quiescent. Disc options will change medications and fu lab and pv Hyzaar.  GERD poss aggravated by weight change and diet  HT controlled on meds  Will follow up with med; change due for labs  Weight gain .  Disc intervention monitoring  appetite suppressants poor record of long term success in most situations  . Weight  watchers or something similar and fu.  Migraines check on formularies for her meds

## 2011-06-21 ENCOUNTER — Encounter: Payer: Self-pay | Admitting: Internal Medicine

## 2011-06-21 DIAGNOSIS — J45990 Exercise induced bronchospasm: Secondary | ICD-10-CM | POA: Insufficient documentation

## 2011-06-21 DIAGNOSIS — K219 Gastro-esophageal reflux disease without esophagitis: Secondary | ICD-10-CM | POA: Insufficient documentation

## 2011-06-27 ENCOUNTER — Telehealth: Payer: Self-pay | Admitting: *Deleted

## 2011-06-27 DIAGNOSIS — R05 Cough: Secondary | ICD-10-CM

## 2011-06-27 NOTE — Telephone Encounter (Signed)
Pt aware and appt made.

## 2011-06-27 NOTE — Telephone Encounter (Signed)
Cough has gotten worse. Pt has started hearing a crackling sound when she lays down. No sob, some wheezing. No fever. Crackling started a few days back and like a heaviness in her chest.

## 2011-06-27 NOTE — Telephone Encounter (Signed)
Left message to call back Order placed in epic for cxr.

## 2011-06-27 NOTE — Telephone Encounter (Signed)
Could be infection or asthmatic   rec chest x ray and fu   See if other provider can see her.

## 2011-06-28 ENCOUNTER — Ambulatory Visit: Payer: 59 | Admitting: Family Medicine

## 2011-07-26 ENCOUNTER — Other Ambulatory Visit: Payer: 59

## 2011-07-26 ENCOUNTER — Other Ambulatory Visit (INDEPENDENT_AMBULATORY_CARE_PROVIDER_SITE_OTHER): Payer: 59

## 2011-07-26 DIAGNOSIS — Z0000 Encounter for general adult medical examination without abnormal findings: Secondary | ICD-10-CM

## 2011-07-26 LAB — POCT URINALYSIS DIPSTICK
Spec Grav, UA: >=1.03
Urobilinogen, UA: 0.2

## 2011-07-26 LAB — TSH: TSH: 1.79 u[IU]/mL (ref 0.35–5.50)

## 2011-07-26 LAB — BASIC METABOLIC PANEL
CO2: 27 mEq/L (ref 19–32)
Chloride: 108 mEq/L (ref 96–112)
Creatinine, Ser: 0.7 mg/dL (ref 0.4–1.2)
GFR: 95.98 mL/min (ref >60.00)
Glucose, Bld: 76 mg/dL (ref 70–99)
Potassium: 4.2 mEq/L (ref 3.5–5.1)

## 2011-07-26 LAB — LIPID PANEL
Cholesterol: 196 mg/dL (ref 0–200)
HDL: 51.9 mg/dL (ref >39.00)
LDL Cholesterol: 128 mg/dL — ABNORMAL HIGH (ref 0–99)
Triglycerides: 82 mg/dL (ref 0.0–149.0)
VLDL: 16.4 mg/dL (ref 0.0–40.0)

## 2011-07-26 LAB — CBC WITH DIFFERENTIAL/PLATELET
Basophils Absolute: 0 10*3/uL (ref 0.0–0.1)
Eosinophils Absolute: 0 10*3/uL (ref 0.0–0.7)
HCT: 39.8 % (ref 36.0–46.0)
Hemoglobin: 13.3 g/dL (ref 12.0–15.0)
Lymphs Abs: 3.1 10*3/uL (ref 0.7–4.0)
MCHC: 33.3 g/dL (ref 30.0–36.0)
Monocytes Absolute: 0.6 10*3/uL (ref 0.1–1.0)
Monocytes Relative: 5.6 % (ref 3.0–12.0)
Neutro Abs: 7.5 10*3/uL (ref 1.4–7.7)
Platelets: 332 10*3/uL (ref 150.0–400.0)
RDW: 13.1 % (ref 11.5–14.6)

## 2011-07-26 LAB — HEPATIC FUNCTION PANEL
AST: 17 U/L (ref 0–37)
Albumin: 4.4 g/dL (ref 3.5–5.2)
Total Bilirubin: 0.6 mg/dL (ref 0.3–1.2)

## 2011-08-06 ENCOUNTER — Other Ambulatory Visit (HOSPITAL_COMMUNITY)
Admission: RE | Admit: 2011-08-06 | Discharge: 2011-08-06 | Disposition: A | Discharge status: Home / Self Care | Payer: 59 | Source: Ambulatory Visit | Attending: Internal Medicine | Admitting: Internal Medicine

## 2011-08-06 ENCOUNTER — Ambulatory Visit (INDEPENDENT_AMBULATORY_CARE_PROVIDER_SITE_OTHER): Payer: 59 | Admitting: Internal Medicine

## 2011-08-06 ENCOUNTER — Encounter: Payer: Self-pay | Admitting: Internal Medicine

## 2011-08-06 VITALS — BP 120/80 | HR 80 | Ht 62.0 in | Wt 190.0 lb

## 2011-08-06 DIAGNOSIS — L259 Unspecified contact dermatitis, unspecified cause: Secondary | ICD-10-CM

## 2011-08-06 DIAGNOSIS — G43909 Migraine, unspecified, not intractable, without status migrainosus: Secondary | ICD-10-CM

## 2011-08-06 DIAGNOSIS — Z0000 Encounter for general adult medical examination without abnormal findings: Secondary | ICD-10-CM

## 2011-08-06 DIAGNOSIS — Z01419 Encounter for gynecological examination (general) (routine) without abnormal findings: Secondary | ICD-10-CM | POA: Insufficient documentation

## 2011-08-06 DIAGNOSIS — R82998 Other abnormal findings in urine: Secondary | ICD-10-CM

## 2011-08-06 DIAGNOSIS — I10 Essential (primary) hypertension: Secondary | ICD-10-CM

## 2011-08-06 DIAGNOSIS — R8290 Unspecified abnormal findings in urine: Secondary | ICD-10-CM | POA: Insufficient documentation

## 2011-08-06 LAB — POCT URINALYSIS DIPSTICK
Glucose, UA: NEGATIVE
Ketones, UA: NEGATIVE
Leukocytes, UA: NEGATIVE
Protein, UA: NEGATIVE
Urobilinogen, UA: 0.2

## 2011-08-06 MED ORDER — RIZATRIPTAN BENZOATE 10 MG PO TABS: 10.0000 mg | ORAL_TABLET | ORAL | Status: DC | PRN

## 2011-08-06 MED ORDER — FLUOCINONIDE 0.05 % EX OINT: TOPICAL_OINTMENT | Freq: Two times a day (BID) | CUTANEOUS | Status: DC

## 2011-08-06 NOTE — Progress Notes (Signed)
Subjective:    Patient ID: Angelica Wells, female    DOB: [birth year], 51 y.o.   MRN: [id number]  HPI Patient comes in today for Preventive Health Care visit  HA:  Called insurance zomig best but will pay for maxalt  Cough  Get a clinic for congested antibiotic .  Change made no difference .   Back on acei     And sees no different has minimal residual cough.  HT controlled at present Skin hand eczema   needs refill lidex only uses ocassionally   Review of Systems ROS:  GEN/ HEENTNo fever, significant weight changes sweats  vision problems hearing changes, CV/ PULM; No chest pain shortness of breath  syncope,edema  change in exercise tolerance. GI /GU: No adominal pain, vomiting, change in bowel habits. No blood in the stool. No significant GU symptoms. But gets urgency and tends to have abn uas but no documented infections SKIN/HEME: ,no acute skin rashes suspicious lesions or bleeding. No lymphadenopathy, nodules, masses.  NEURO/ PSYCH:  No neurologic signs such as weakness numbness No depression anxiety. IMM/ Allergy: No unusual infections.  Allergy .    See hpi REST of 12 system review negative  Past Medical History  Diagnosis Date  . Hypertension   . GERD (gastroesophageal reflux disease)   . Aortic regurgitation   . Migraine   . Esophageal stricture   . Exercise-induced asthma     History   Social History  . Marital Status: Married    Spouse Name: N/A    Number of Children: N/A  . Years of Education: N/A   Occupational History  . Not on file.   Social History Main Topics  . Smoking status: Never Smoker   . Smokeless tobacco: Not on file  . Alcohol Use: Yes     socially  . Drug Use: No  . Sexually Active: Not on file   Other Topics Concern  . Not on file   Social History Narrative   MarriedRegular exercise- no2 caffeineHH of 4No petBS degreeWork in substitute teaching ocassionally    Past Surgical History  Procedure Date  . Breast reduction surgery     . Uterine ablation   . Myomectomy   . Cesarean section   . Tonsillectomy     Family History  Problem Relation Age of Onset  . Hypertension Mother   . Other Father     bladder cancer  . Other Mother     mvp, bicuspid aortic valve cabg, valve replacement  . Migraines Mother     went away after menopause    Allergies  Allergen Reactions  . Sulfonamide Derivatives     Current Outpatient Prescriptions on File Prior to Visit  Medication Sig Dispense Refill  . calcium-vitamin D (OSCAL WITH D) 500-200 MG-UNIT per tablet Take 1 tablet by mouth daily.        . cetirizine (ZYRTEC) 10 MG tablet Take 10 mg by mouth daily.        . pantoprazole (PROTONIX) 40 MG tablet TAKE 1 TABLET DAILY  90 tablet  2  . ZOMIG 5 MG tablet TAKE 1 TO 2 TABLETS AS NEEDED  30 tablet  0  . aspirin 81 MG tablet Take 81 mg by mouth daily.        . cholecalciferol (VITAMIN D) 1000 UNITS tablet Take 1,000 Units by mouth daily.        . fish oil-omega-3 fatty acids 1000 MG capsule Take 2 g by mouth daily.  BP 120/80  Pulse 80  Ht 5\' 2"  (1.575 m)  Wt 190 lb (86.183 kg)  BMI 34.75 kg/m2       Objective:   Physical Exam  Physical Exam: Vital signs reviewed WUJ:WJXB is a well-developed well-nourished alert cooperative  white female who appears her stated age in no acute distress.  HEENT: normocephalic atraumatic , Eyes: PERRL EOM's full, conjunctiva clear, Nares: paten,t no deformity discharge or tenderness., Ears: no deformity EAC's clear TMs with normal landmarks. Mouth: clear OP, no lesions, edema.  Moist mucous membranes. Dentition in adequate repair. NECK: supple without masses, thyromegaly or bruits. CHEST/PULM:  Clear to auscultation and percussion breath sounds equal no wheeze , rales or rhonchi. No chest wall deformities or tenderness. ocass cough CV: PMI is nondisplaced, S1 S2 no gallops, murmurs, rubs. Peripheral pulses are full without delay.No JVD .  Breast: normal by inspection . No  dimpling, discharge, masses, tenderness or discharge . ABDOMEN: Bowel sounds normal nontender  No guard or rebound, no hepato splenomegal no CVA tenderness.  No hernia. Extremtities:  No clubbing cyanosis or edema, no acute joint swelling or redness no focal atrophy NEURO:  Oriented x3, cranial nerves 3-12 appear to be intact, no obvious focal weakness,gait within normal limits no abnormal reflexes or asymmetrical SKIN: No acute rashes normal turgor, color, no bruising or petechiae.  Thumbs and nail bases red and irritated  ( picks at nails)  PSYCH: Oriented, good eye contact, no obvious depression anxiety, cognition and judgment appear normal. LN: no cervical axillary inguinal adenopathy  Pelvic: NL ext GU, labia clear without lesions or rash . Vagina no lesions .Cervix: clear  UTERUS: Neg CMT Adnexa:  clear no masses . PAP done REctal no masses heme negative.  Lab Results  Component Value Date   WBC 11.3* 07/26/2011   HGB 13.3 07/26/2011   HCT 39.8 07/26/2011   PLT 332.0 07/26/2011   GLUCOSE 76 07/26/2011   CHOL 196 07/26/2011   TRIG 82.0 07/26/2011   HDL 51.90 07/26/2011   LDLCALC 128* 07/26/2011   ALT 12 07/26/2011   AST 17 07/26/2011   NA 144 07/26/2011   K 4.2 07/26/2011   CL 108 07/26/2011   CREATININE 0.7 07/26/2011   BUN 15 07/26/2011   CO2 27 07/26/2011   TSH 1.79 07/26/2011        Assessment & Plan:  Preventive Health Care Counseled regarding healthy nutrition, exercise, sleep, injury prevention, calcium vit d and healthy weight . GYNE exam    Abn ua  Repeat with cx ? If has had utis  Cough better   Prolonged after resp infection  Poss rad   Back on acei  HT seems controlled Migraines   Insurance will not pay for  Most effective med for her Zomig.    But will pay for maxalt and she is willing to try again with  36 pills for 90 days rx .  Will [facility].   Hand dermatitis  otc emmollient and refill the lidex.

## 2011-08-06 NOTE — Patient Instructions (Signed)
Can try maxalt  continue other meds   Will let you know about pap and urine culture . If doing well check up in a year or as needed

## 2011-08-12 NOTE — Progress Notes (Signed)
Quick Note:  Tell patient PAP is normal. Also her urine culture showed no bacteria and no infection. ______

## 2011-08-13 NOTE — Progress Notes (Signed)
Quick Note:  Pt aware of results. ______ 

## 2011-08-23 ENCOUNTER — Telehealth: Payer: Self-pay | Admitting: *Deleted

## 2011-08-23 MED ORDER — SUMATRIPTAN SUCCINATE 100 MG PO TABS: 100.0000 mg | ORAL_TABLET | ORAL | Status: DC | PRN

## 2011-08-23 NOTE — Telephone Encounter (Signed)
Per Dr. Panosh- ok to do. 

## 2011-08-23 NOTE — Telephone Encounter (Signed)
I cannot do a prior auth until the Rx has been submitted to the pharmacy and rejected. Then pharm will send me request. I have not received a request, has it been called in? Thanks!

## 2011-08-23 NOTE — Telephone Encounter (Signed)
Rx sent in to pharmacy. 

## 2011-08-23 NOTE — Telephone Encounter (Signed)
Pt called stating that Maxalt didn't work. She took 7 tabs and they didn't help her migraines. She called her ins company and they need a prior auth for Imitrex 100 mg generic #36 tabs. The number is [phone].

## 2011-08-26 ENCOUNTER — Telehealth: Payer: Self-pay | Admitting: *Deleted

## 2011-08-26 MED ORDER — SUMATRIPTAN SUCCINATE 100 MG PO TABS: 100.0000 mg | ORAL_TABLET | ORAL | Status: DC | PRN

## 2011-08-26 NOTE — Telephone Encounter (Signed)
Pt called stating that she wants #27 imitrex sent to [facility] and cancel the #36 sent to local pharmacy. Rx cancelled at local pharmacy

## 2011-09-09 ENCOUNTER — Telehealth: Payer: Self-pay | Admitting: Internal Medicine

## 2011-09-09 MED ORDER — ATENOLOL 100 MG PO TABS: 100.0000 mg | ORAL_TABLET | Freq: Every day | ORAL | Status: DC

## 2011-09-09 NOTE — Telephone Encounter (Signed)
Rx sent to pharmacy   

## 2011-09-09 NOTE — Telephone Encounter (Signed)
atenolol (TENORMIN) 100 MG tablet - need 90 day supply called into [facility]

## 2011-10-23 ENCOUNTER — Other Ambulatory Visit: Payer: Self-pay | Admitting: Gastroenterology

## 2011-11-01 ENCOUNTER — Other Ambulatory Visit: Payer: Self-pay | Admitting: *Deleted

## 2011-11-01 MED ORDER — ZOLMITRIPTAN 5 MG PO TABS: 5.0000 mg | ORAL_TABLET | ORAL | Status: DC | PRN

## 2011-11-07 ENCOUNTER — Telehealth: Payer: Self-pay | Admitting: *Deleted

## 2011-11-07 NOTE — Telephone Encounter (Addendum)
Pharmacist needs to speak to Dr. Fabian Sharp or Elease Hashimoto re: questions on pt's meds.  The caller did not know what these questions were.

## 2011-11-21 ENCOUNTER — Telehealth: Payer: Self-pay

## 2011-11-21 NOTE — Telephone Encounter (Signed)
Voicemail:  Pt states she was trying amitriptyline and needs a new rx sent to [facility] #27 for a 3 month supply.  Pls advise.

## 2011-11-22 NOTE — Telephone Encounter (Signed)
I dont think I ever prescribed  .  This med  Needs ov  Or explanation of scenarios.

## 2011-11-25 NOTE — Telephone Encounter (Signed)
Pt stated Dr Fabian Sharp did prescribed med. Pt is out of meds

## 2011-11-26 NOTE — Telephone Encounter (Signed)
Called and spoke with pt is aware message has been sent to Dr. Fabian Sharp with correct rx.

## 2011-11-26 NOTE — Telephone Encounter (Signed)
Ok to refill imitrex 27 #

## 2011-11-26 NOTE — Telephone Encounter (Signed)
Pt called back and states the rx was for Imitrex.  Pt last seen 08/06/11.  Rx last filled 08/26/11 #27.  Pls advise.

## 2011-11-27 MED ORDER — SUMATRIPTAN SUCCINATE 100 MG PO TABS: 100.0000 mg | ORAL_TABLET | ORAL | Status: DC | PRN

## 2011-11-27 NOTE — Telephone Encounter (Signed)
Left a detailed message on pt's voicemail that rx had been sent to pharmacy.

## 2011-12-16 ENCOUNTER — Other Ambulatory Visit: Payer: Self-pay | Admitting: Internal Medicine

## 2011-12-17 ENCOUNTER — Other Ambulatory Visit: Payer: Self-pay | Admitting: Obstetrics and Gynecology

## 2011-12-17 DIAGNOSIS — Z1231 Encounter for screening mammogram for malignant neoplasm of breast: Secondary | ICD-10-CM

## 2011-12-27 ENCOUNTER — Ambulatory Visit: Payer: 59 | Admitting: Gastroenterology

## 2012-01-08 ENCOUNTER — Ambulatory Visit (HOSPITAL_COMMUNITY)
Admission: RE | Admit: 2012-01-08 | Discharge: 2012-01-08 | Disposition: A | Discharge status: Home / Self Care | Payer: 59 | Source: Ambulatory Visit | Attending: Obstetrics and Gynecology | Admitting: Obstetrics and Gynecology

## 2012-01-08 DIAGNOSIS — Z1231 Encounter for screening mammogram for malignant neoplasm of breast: Secondary | ICD-10-CM

## 2012-02-21 ENCOUNTER — Other Ambulatory Visit: Payer: Self-pay | Admitting: Internal Medicine

## 2012-02-24 ENCOUNTER — Other Ambulatory Visit: Payer: Self-pay | Admitting: Internal Medicine

## 2012-07-17 ENCOUNTER — Telehealth: Payer: Self-pay | Admitting: Internal Medicine

## 2012-07-17 NOTE — Telephone Encounter (Signed)
Pt wants to switch her migraine meds to Zolmitriptan, 5 mg b/c it's the generic for Zomig. Her ins will cover a 90 day supply Rx of 24 tabs. Pt asked to remind you that the Rx will have to go thru [facility]. Thank you.

## 2012-07-17 NOTE — Telephone Encounter (Signed)
Ok to change to zomig 5  Use prn migraine and repeat in 2 hours  Disp 90 days no refills ? i dont know how much they will cover ? 18   She is do for a yearly check  Or OV  Before runs out of  This med

## 2012-07-22 NOTE — Telephone Encounter (Signed)
Misty, this was routed back to me b/c I sent it on Friday, July 17, 2012 to Dr. Fabian Sharp while I was helping out at your location. Since I am at the [facility] location and can't handle this, I thought it needed to be routed back to you. Thank you.

## 2012-07-23 MED ORDER — ZOLMITRIPTAN 5 MG PO TABS: 5.0000 mg | ORAL_TABLET | ORAL | Status: DC | PRN

## 2012-07-23 NOTE — Telephone Encounter (Signed)
Sent to [facility] by e-scribe. 

## 2012-08-19 ENCOUNTER — Other Ambulatory Visit: Payer: Self-pay | Admitting: Internal Medicine

## 2012-12-23 ENCOUNTER — Encounter: Payer: Self-pay | Admitting: Cardiovascular Disease

## 2012-12-28 ENCOUNTER — Telehealth: Payer: Self-pay | Admitting: Obstetrics and Gynecology

## 2012-12-28 NOTE — Telephone Encounter (Signed)
INSURANCE HAS CHANGED, PT NEEDS NEW RX TO [facility] [phone] FOR ESTRING 2MG . FOR 90 DAYS AT TIME.

## 2012-12-28 NOTE — Telephone Encounter (Signed)
After reviewing patient's paper chart, she has 2 Rf of the E-string left but the 1 year supply she was given during a problem visit with Dr. Tresa Res patient has not had an annual since 09/29/09 and does not have anything upcoming. Please advise chart on your desk.

## 2012-12-29 NOTE — Telephone Encounter (Signed)
Needs AEX for next refill.

## 2013-01-04 ENCOUNTER — Telehealth: Payer: Self-pay | Admitting: Obstetrics and Gynecology

## 2013-01-04 NOTE — Telephone Encounter (Signed)
Patient needs refill on her Estring . Her insurance changed to [facility]. And new insurance will not take transfers on med refills. Patient is overdue 3 weeks.

## 2013-01-04 NOTE — Telephone Encounter (Signed)
Patient calling to check on status of request for refill as she is "behind two weeks on taking it." Please advise. Patient left message at lunch. Called patient back and left message to call us and schedule AEX.

## 2013-01-05 ENCOUNTER — Other Ambulatory Visit: Payer: Self-pay | Admitting: Orthopedic Surgery

## 2013-01-05 MED ORDER — ESTRADIOL 2 MG VA RING: 2.0000 mg | VAGINAL_RING | VAGINAL | Status: DC

## 2013-01-05 NOTE — Telephone Encounter (Signed)
OK to RF through AEX

## 2013-01-05 NOTE — Telephone Encounter (Signed)
Spoke with Angelica Wells about estring. Angelica Wells needs to switch to [facility]. Needs new Rx sent because they will not accept transfer from [facility]. Angelica Wells does not want to get estring through [facility] because it will cost her $25 more. Angelica Wells saw PCP for  Aex and she is due this August for next Aex. Advised Angelica Wells that CR is retiring in August, and Angelica Wells states she will continue to see her PCP then. OK to send Rx to [facility] for enough estring refills to get her thru to Aex?

## 2013-01-05 NOTE — Telephone Encounter (Signed)
Spoke with pt about estring refill. Changed pharmacy to [facility] and pt should get refill to cover her for the next 3 months. Advised pt if she is planning to see PCP from now on for Aex's, he can refill after her next exam with him. Pt agreeable.

## 2013-01-05 NOTE — Telephone Encounter (Signed)
LMTCB  aa 

## 2013-01-05 NOTE — Telephone Encounter (Signed)
LMTCB  aa    (It looks like pt has current Rx for estring at [facility]. Tresa Endo cancelled Rx thru [facility] in Feb and called to [facility] with 2 RF. Was going to ask about that.)

## 2013-10-19 ENCOUNTER — Encounter: Payer: Self-pay | Admitting: Obstetrics and Gynecology

## 2013-10-19 DIAGNOSIS — L309 Dermatitis, unspecified: Secondary | ICD-10-CM | POA: Insufficient documentation

## 2013-10-19 DIAGNOSIS — I10 Essential (primary) hypertension: Secondary | ICD-10-CM | POA: Insufficient documentation

## 2013-10-20 ENCOUNTER — Ambulatory Visit (INDEPENDENT_AMBULATORY_CARE_PROVIDER_SITE_OTHER): Payer: 59 | Admitting: Gynecology

## 2013-10-20 ENCOUNTER — Encounter: Payer: Self-pay | Admitting: Gynecology

## 2013-10-20 VITALS — BP 106/62 | Resp 18 | Ht 62.5 in | Wt 175.0 lb

## 2013-10-20 DIAGNOSIS — Z0000 Encounter for general adult medical examination without abnormal findings: Secondary | ICD-10-CM

## 2013-10-20 DIAGNOSIS — Z01419 Encounter for gynecological examination (general) (routine) without abnormal findings: Secondary | ICD-10-CM

## 2013-10-20 DIAGNOSIS — N952 Postmenopausal atrophic vaginitis: Secondary | ICD-10-CM

## 2013-10-20 DIAGNOSIS — Z124 Encounter for screening for malignant neoplasm of cervix: Secondary | ICD-10-CM

## 2013-10-20 LAB — POCT URINALYSIS DIPSTICK
Urobilinogen, UA: NEGATIVE
pH, UA: 5

## 2013-10-20 MED ORDER — ESTRADIOL 10 MCG VA TABS: ORAL_TABLET | VAGINAL | Status: DC

## 2013-10-20 NOTE — Patient Instructions (Signed)

## 2013-10-20 NOTE — Progress Notes (Signed)
53 y.o. Married Caucasian female   G2P2002 here for annual exam. Pt reports menses are absent due to Ablation. She does report hot flashes, does have night sweats, does have vaginal dryness.  She is using lubricants, OTC Replens .  She does not report post-menopasual bleeding.  Pt had HTA ablation in 2005 no bleeding since.  Pt was on estring for vaginal dryness but stopped due to headache for 1w, every ring change.  Headaches responded to excedrin.  menopausal symptoms are getting better?  No medication needed.  No LMP recorded. Patient has had an ablation.          Sexually active: yes  The current method of family planning is post menopausal status and husband has Vasectomy   Exercising: no  The patient does not participate in regular exercise at present.  Last pap:08/06/11 Neg Abnormal PAP: no Mammogram: 10/09/11 Bi-Rads 1  BSE: occasionally  Colonoscopy: none  DEXA: none  Alcohol: 4 drinks/month Tobacco: no  Labs: Reita Cliche, MD  Health Maintenance  Topic Date Due  . Mammogram  03/23/2012  . Colonoscopy  03/23/2012  . Tetanus/tdap  12/28/2013  . Influenza Vaccine  02/12/2014  . Pap Smear  08/05/2014    Family History  Problem Relation Age of Onset  . Hypertension Mother   . Other Father     bladder cancer  . Other Mother     mvp, bicuspid aortic valve cabg, valve replacement  . Migraines Mother     went away after menopause  . Breast cancer Maternal Grandmother 23  . Hypertension Father     Patient Active Problem List   Diagnosis Date Noted  . Eczema   . Hypertension   . Routine gynecological examination 08/06/2011  . Abnormal urine 08/06/2011  . Visit for preventive health examination 08/06/2011  . GERD (gastroesophageal reflux disease) 06/21/2011  . Exercise-induced asthma   . OBESITY 05/14/2010  . DERMATITIS, HANDS 05/14/2010  . MIGRAINE HEADACHE 09/09/2007  . HYPERTENSION 09/09/2007  . OVERACTIVE BLADDER 09/09/2007    Past Medical History  Diagnosis  Date  . Hypertension   . GERD (gastroesophageal reflux disease)   . Aortic regurgitation   . Migraine without aura 05/2005  . Esophageal stricture   . Exercise-induced asthma   . Eczema   . History of uterine fibroid     ut10x7x6 cm     Past Surgical History  Procedure Laterality Date  . Breast reduction surgery    . Uterine ablation    . Myomectomy  1995  . Cesarean section    . Tonsillectomy    . Abdominoplasty  2000  . Ablation  01/06/2004    HTA    Allergies: Sulfonamide derivatives  Current Outpatient Prescriptions  Medication Sig Dispense Refill  . atenolol (TENORMIN) 100 MG tablet TAKE 1 TABLET DAILY  90 tablet  0  . cetirizine (ZYRTEC) 10 MG tablet Take 10 mg by mouth as needed.       . fluocinonide ointment (LIDEX) 0.05 % Apply topically 2 (two) times daily.  30 g  0  . lisinopril-hydrochlorothiazide (PRINZIDE,ZESTORETIC) 10-12.5 MG per tablet Take 1 tablet by mouth daily.      . pantoprazole (PROTONIX) 40 MG tablet TAKE 1 TABLET DAILY  90 tablet  2  . zolmitriptan (ZOMIG) 5 MG tablet Take 1 tablet (5 mg total) by mouth as needed for migraine.  24 tablet  0  . aspirin 81 MG tablet Take 81 mg by mouth daily.        Marland Kitchen  calcium-vitamin D (OSCAL WITH D) 500-200 MG-UNIT per tablet Take 1 tablet by mouth daily.        . cholecalciferol (VITAMIN D) 1000 UNITS tablet Take 1,000 Units by mouth daily.        Marland Kitchen estradiol (ESTRING) 2 MG vaginal ring Place 2 mg vaginally every 3 (three) months. follow package directions  1 each  0  . fish oil-omega-3 fatty acids 1000 MG capsule Take 2 g by mouth daily.        Marland Kitchen OVER THE COUNTER MEDICATION Goodies Power      . rizatriptan (MAXALT) 10 MG tablet Take 1 tablet (10 mg total) by mouth as needed for migraine. May repeat in 2 hours if needed  36 tablet  2   No current facility-administered medications for this visit.    ROS: Pertinent items are noted in HPI.  Exam:    BP 106/62  Resp 18  Ht 5' 2.5" (1.588 m)  Wt 175 lb (79.379  kg)  BMI 31.48 kg/m2 Weight change: @WEIGHTCHANGE @ Last 3 height recordings:  Ht Readings from Last 3 Encounters:  10/20/13 5' 2.5" (1.588 m)  08/06/11 5\' 2"  (1.575 m)  07/18/10 5\' 2"  (1.575 m)   General appearance: alert, cooperative and appears stated age Head: Normocephalic, without obvious abnormality, atraumatic Neck: no adenopathy, no carotid bruit, no JVD, supple, symmetrical, trachea midline and thyroid not enlarged, symmetric, no tenderness/mass/nodules Lungs: clear to auscultation bilaterally Breasts: normal appearance, no masses or tenderness, s/p reduction Heart: regular rate and rhythm, S1, S2 normal, no murmur, click, rub or gallop Abdomen: soft, non-tender; bowel sounds normal; no masses,  no organomegaly Extremities: extremities normal, atraumatic, no cyanosis or edema Skin: Skin color, texture, turgor normal. No rashes or lesions Lymph nodes: Cervical, supraclavicular, and axillary nodes normal. no inguinal nodes palpated Neurologic: Grossly normal   Pelvic: External genitalia:  no lesions              Urethra: normal appearing urethra with no masses, tenderness or lesions              Bartholins and Skenes: normal                 Vagina: normal appearing vagina with normal color and discharge, no lesions              Cervix: normal appearance              Pap taken: yes        Bimanual Exam:  Uterus:  uterus is normal size, shape, consistency and nontender                                      Adnexa:    normal adnexa in size, nontender and no masses                                      Rectovaginal: Confirms                                      Anus:  normal sphincter tone, no lesions  A: well woman Contraceptive management     P: mammogram-3D pap smear with HRHPV Rx vagifem will see if she develops headaches, if so, may  chose to return to estring counseled on breast self exam, mammography screening, menopause, adequate intake of calcium and vitamin D, diet  and exercise return annually or prn Discussed PAP guideline changes, importance of weight bearing exercises, calcium, vit D and balanced diet.  An After Visit Summary was printed and given to the patient.

## 2013-10-22 LAB — IPS PAP TEST WITH HPV

## 2013-11-10 ENCOUNTER — Telehealth: Payer: Self-pay | Admitting: Gynecology

## 2013-11-10 DIAGNOSIS — N952 Postmenopausal atrophic vaginitis: Secondary | ICD-10-CM

## 2013-11-10 MED ORDER — ESTRADIOL 2 MG VA RING: 2.0000 mg | VAGINAL_RING | VAGINAL | Status: DC

## 2013-11-10 NOTE — Telephone Encounter (Signed)
Patient notified and offers thanks to Dr. Charlies Constable.   Routing to provider for final review. Patient agreeable to disposition. Will close encounter

## 2013-11-10 NOTE — Telephone Encounter (Signed)
The patient was not bothered by headaches and was unsure if she wanted an rx change, it seems that she wants to resume the estring , I will drop the order

## 2013-11-10 NOTE — Telephone Encounter (Signed)
Left message to call Merilyn Pagan at [phone]. 

## 2013-11-10 NOTE — Telephone Encounter (Signed)
Patient wants to let dr lathrop know that she wants to go ahead an try the estring she was telling her about wants the rx sent to  [facility] [phone]

## 2013-11-10 NOTE — Telephone Encounter (Signed)
Need further information:  Did patient try Vagifem? Was it causing headaches? Patient has previously been on Estring. Need to clarify prior to sending order request to Dr. Charlies Constable.

## 2014-05-04 ENCOUNTER — Telehealth: Payer: Self-pay | Admitting: Gastroenterology

## 2014-05-04 NOTE — Telephone Encounter (Signed)
Reports she is having spells of feeling like she cannot swallow and then she panics. Denies regurgitation or pain. Appointment scheduled for evaluation.

## 2014-05-05 ENCOUNTER — Encounter: Payer: Self-pay | Admitting: Gastroenterology

## 2014-05-05 ENCOUNTER — Ambulatory Visit (INDEPENDENT_AMBULATORY_CARE_PROVIDER_SITE_OTHER): Payer: 59 | Admitting: Gastroenterology

## 2014-05-05 VITALS — BP 148/82 | HR 72 | Ht 61.75 in | Wt 173.5 lb

## 2014-05-05 DIAGNOSIS — Z1211 Encounter for screening for malignant neoplasm of colon: Secondary | ICD-10-CM | POA: Insufficient documentation

## 2014-05-05 DIAGNOSIS — K222 Esophageal obstruction: Secondary | ICD-10-CM | POA: Insufficient documentation

## 2014-05-05 DIAGNOSIS — R1314 Dysphagia, pharyngoesophageal phase: Secondary | ICD-10-CM | POA: Insufficient documentation

## 2014-05-05 MED ORDER — NA SULFATE-K SULFATE-MG SULF 17.5-3.13-1.6 GM/177ML PO SOLN: ORAL | Status: DC

## 2014-05-05 NOTE — Progress Notes (Signed)
05/05/2014 Angelica Wells [id number] 1960/10/09   HISTORY OF PRESENT ILLNESS:  This is a pleasant 53 year old female who is previously known to Dr. Deatra Ina.  She has a history of esophageal stricture on EGD in 08/2007 that was dilated with [facility] dilator to 18 mm.  She also had esophagitis at that time.  She is currently on pantoprazole 40 mg daily, but sometimes she takes it twice a day.  She is here today with recurrent complaints of dysphagia.  Has intermittent dysphagia to solid food such as meats and breads for the past 6 weeks or so.  No dysphagia to liquids.  Food gets hung up but then eventually goes down after she swallows hard.  Makes it panic when it occurs.  Does still experience heartburn/reflux while taking PPI, but does try to watch her diet as well.  She's never undergone colonoscopy in the past.  She denies rectal bleeding or issues with her bowels.   Past Medical History  Diagnosis Date  . Hypertension   . GERD (gastroesophageal reflux disease)   . Aortic regurgitation   . Migraine without aura 05/2005  . Esophageal stricture   . Exercise-induced asthma   . Eczema   . History of uterine fibroid     ut10x7x6 cm    Past Surgical History  Procedure Laterality Date  . Breast reduction surgery    . Uterine ablation    . Myomectomy  1995  . Cesarean section  1996, 1998  . Tonsillectomy    . Abdominoplasty  2000  . Ablation  01/06/2004    HTA    reports that she has never smoked. She does not have any smokeless tobacco history on file. She reports that she drinks alcohol. She reports that she does not use illicit drugs. family history includes Bladder Cancer in her father; Breast cancer (age of onset: 32) in her maternal grandmother; Hypertension in her father and mother; Migraines in her mother; Other in her mother. Allergies  Allergen Reactions  . Sulfonamide Derivatives       Outpatient Encounter Prescriptions as of 05/05/2014  Medication Sig  . atenolol  (TENORMIN) 100 MG tablet TAKE 1 TABLET DAILY  . calcium-vitamin D (OSCAL WITH D) 500-200 MG-UNIT per tablet Take 1 tablet by mouth daily.    . cetirizine (ZYRTEC) 10 MG tablet Take 10 mg by mouth as needed.   Marland Kitchen estradiol (ESTRING) 2 MG vaginal ring Place 2 mg vaginally every 3 (three) months. follow package directions  . fluocinonide ointment (LIDEX) 0.05 % Apply topically 2 (two) times daily.  Marland Kitchen lisinopril-hydrochlorothiazide (PRINZIDE,ZESTORETIC) 10-12.5 MG per tablet Take 1 tablet by mouth daily.  . pantoprazole (PROTONIX) 40 MG tablet TAKE 1 TABLET DAILY  . zolmitriptan (ZOMIG) 5 MG tablet Take 1 tablet (5 mg total) by mouth as needed for migraine.  . [DISCONTINUED] aspirin 81 MG tablet Take 81 mg by mouth daily.    . [DISCONTINUED] cholecalciferol (VITAMIN D) 1000 UNITS tablet Take 1,000 Units by mouth daily.    . [DISCONTINUED] fish oil-omega-3 fatty acids 1000 MG capsule Take 2 g by mouth daily.    . [DISCONTINUED] OVER THE COUNTER MEDICATION Goodies Power  . Na Sulfate-K Sulfate-Mg Sulf (SUPREP BOWEL PREP) SOLN USE PER PREP INSTRUCTIONS  . rizatriptan (MAXALT) 10 MG tablet Take 1 tablet (10 mg total) by mouth as needed for migraine. May repeat in 2 hours if needed     REVIEW OF SYSTEMS  : All other systems reviewed  and negative except where noted in the History of Present Illness.   PHYSICAL EXAM: BP 148/82  Pulse 72  Ht 5' 1.75" (1.568 m)  Wt 173 lb 8 oz (78.699 kg)  BMI 32.01 kg/m2 General: Well developed white female in no acute distress Head: Normocephalic and atraumatic Eyes:  Sclerae anicteric, conjunctiva pink. Ears: Normal auditory acuity Lungs: Clear throughout to auscultation Heart: Regular rate and rhythm Abdomen: Soft, non-distended.  Normal bowel sounds.  Non-tender. Rectal:  Will be done at the time of colonoscopy. Musculoskeletal: Symmetrical with no gross deformities  Skin: No lesions on visible extremities Extremities: No edema  Neurological: Alert  oriented x 4, grossly non-focal Psychological:  Alert and cooperative. Normal mood and affect  ASSESSMENT AND PLAN: -Screening colonoscopy:  Will schedule with Dr. Deatra Ina.   -Dysphagia and history of esophageal stricture:  Suspect recurrent stricture.  Will schedule for EGD with dilation with Dr. Deatra Ina.  The risks, benefits, and alternatives were discussed with the patient and she consents to proceed.  Will continue pantoprazole 40 mg daily for now.

## 2014-05-05 NOTE — Patient Instructions (Signed)

## 2014-05-08 NOTE — Progress Notes (Signed)
Reviewed and agree with management. Robert D. Kaplan, M.D., FACG  

## 2014-05-09 ENCOUNTER — Telehealth: Payer: Self-pay | Admitting: *Deleted

## 2014-05-09 NOTE — Telephone Encounter (Signed)
I called the patient and advised Angelica Wells we got a fax from [employer] that the Ehrhardt is too expensive.  I advised the patient that we have a sample prep at our front desk for Angelica Wells. I told Angelica Wells to please pick it up this week. Angelica Wells procedure is 06-24-2014.

## 2014-05-10 ENCOUNTER — Telehealth: Payer: Self-pay | Admitting: Gastroenterology

## 2014-05-12 ENCOUNTER — Encounter: Payer: Self-pay | Admitting: Gastroenterology

## 2014-05-16 ENCOUNTER — Encounter: Payer: Self-pay | Admitting: Gastroenterology

## 2014-05-23 ENCOUNTER — Telehealth: Payer: Self-pay | Admitting: Gynecology

## 2014-05-23 ENCOUNTER — Other Ambulatory Visit: Payer: Self-pay | Admitting: Obstetrics and Gynecology

## 2014-05-23 MED ORDER — ESTRADIOL 10 MCG VA TABS: ORAL_TABLET | VAGINAL | Status: DC

## 2014-05-23 NOTE — Telephone Encounter (Signed)
I placed an order for the Vagifem and discontinued the Estring.  Thanks.

## 2014-05-23 NOTE — Telephone Encounter (Signed)
Patient calling to speak with nurse about "Estring not working" and wants to switch to Vagifem.  [id number] - [place], [place] - [address]

## 2014-05-23 NOTE — Telephone Encounter (Signed)
Spoke with patient. Patient states that she has been using the Estring for nine months without changes. "I feel like I have given it a good try and it isn't doing anything for the vaginal dryness." Patient states that she was previously using Vagifem but only tried it for one month before switching to the Estring as she felt it would be more convenient for her. Patient would like to switch back to the Vagifem at this time and "try this for a longer time to see if it works better." Advised patient will send message to provider and return call with further recommendations or instructions. Patient is agreeable. Patient is using the [facility] on file.   Dr.Silva, okay to place order for Vagifem at this time? Patient was last seen for aex 10/2013 with Dr.Lathrop.

## 2014-05-24 NOTE — Telephone Encounter (Signed)
Spoke with patient. Advised rx for Vagifem has been sent to pharmacy. Patient is agreable.  Routing to provider for final review. Patient agreeable to disposition. Will close encounter

## 2014-06-14 ENCOUNTER — Telehealth: Payer: Self-pay | Admitting: Gynecology

## 2014-06-14 DIAGNOSIS — K200 Eosinophilic esophagitis: Secondary | ICD-10-CM

## 2014-06-14 HISTORY — DX: Eosinophilic esophagitis: K20.0

## 2014-06-14 NOTE — Telephone Encounter (Signed)
Spoke with patient re: need to rs aex from 10/26/14. She will call back to reschedule.

## 2014-06-24 ENCOUNTER — Ambulatory Visit (AMBULATORY_SURGERY_CENTER): Payer: 59 | Admitting: Gastroenterology

## 2014-06-24 ENCOUNTER — Encounter: Payer: Self-pay | Admitting: Gastroenterology

## 2014-06-24 VITALS — BP 114/74 | HR 67 | Temp 98.7°F | Resp 30 | Ht 62.0 in | Wt 173.0 lb

## 2014-06-24 DIAGNOSIS — D129 Benign neoplasm of anus and anal canal: Secondary | ICD-10-CM

## 2014-06-24 DIAGNOSIS — Z1211 Encounter for screening for malignant neoplasm of colon: Secondary | ICD-10-CM

## 2014-06-24 DIAGNOSIS — D128 Benign neoplasm of rectum: Secondary | ICD-10-CM

## 2014-06-24 DIAGNOSIS — K222 Esophageal obstruction: Secondary | ICD-10-CM

## 2014-06-24 DIAGNOSIS — R1310 Dysphagia, unspecified: Secondary | ICD-10-CM

## 2014-06-24 DIAGNOSIS — K200 Eosinophilic esophagitis: Secondary | ICD-10-CM

## 2014-06-24 MED ORDER — SODIUM CHLORIDE 0.9 % IV SOLN: 500.0000 mL | INTRAVENOUS | Status: DC

## 2014-06-24 NOTE — Op Note (Signed)
[place]  [address] [place], [address]   COLONOSCOPY PROCEDURE REPORT  PATIENT: Angelica Wells, Angelica Wells  MR#: #[id number] BIRTHDATE: April 25, 1961 , 53  yrs. old GENDER: female ENDOSCOPIST: Inda Castle, MD REFERRED BY: PROCEDURE DATE:  06/24/2014 PROCEDURE:   Colonoscopy with snare polypectomy First Screening Colonoscopy - Avg.  risk and is 50 yrs.  old or older Yes.  Prior Negative Screening - Now for repeat screening. N/A  History of Adenoma - Now for follow-up colonoscopy & has been > or = to 3 yrs.  N/A  Polyps Removed Today? Yes. ASA CLASS:   Class II INDICATIONS:first colonoscopy and average risk for colon cancer. MEDICATIONS: Monitored anesthesia care and Propofol 100 mg IV  DESCRIPTION OF PROCEDURE:   After the risks benefits and alternatives of the procedure were thoroughly explained, informed consent was obtained.  The digital rectal exam revealed no abnormalities of the rectum.   The LB [id number]  endoscope was introduced through the anus and advanced to the cecum, which was identified by both the appendix and ileocecal valve. No adverse events experienced.   The quality of the prep was excellent using Suprep  The instrument was then slowly withdrawn as the colon was fully examined.      COLON FINDINGS: A sessile polyp measuring 4 mm in size was found in the rectum.  A polypectomy was performed with a cold snare.  The resection was complete, the polyp tissue was completely retrieved and sent to histology.   The examination was otherwise normal. Retroflexed views revealed no abnormalities. The time to cecum=6 minutes 08 seconds.  Withdrawal time=10 minutes 36 seconds.  The scope was withdrawn and the procedure completed. COMPLICATIONS: There were no immediate complications.  ENDOSCOPIC IMPRESSION: 1.   Sessile polyp was found in the rectum; polypectomy was performed with a cold snare 2.   The examination was otherwise  normal  RECOMMENDATIONS: If the polyp(s) removed today are proven to be adenomatous (pre-cancerous) polyps, you will need a repeat colonoscopy in 5 years.  Otherwise you should continue to follow colorectal cancer screening guidelines for "routine risk" patients with colonoscopy in 10 years.  You will receive a letter within 1-2 weeks with the results of your biopsy as well as final recommendations.  Please call my office if you have not received a letter after 3 weeks.  eSigned:  Inda Castle, MD 06/24/2014 3:58 PM   cc: Reita Cliche, MD   PATIENT NAME:  Angelica Wells, Angelica Wells MR#: #[id number]

## 2014-06-24 NOTE — Progress Notes (Signed)
Ptocedure ends, to recovery, report given and VSS.

## 2014-06-24 NOTE — Patient Instructions (Signed)
YOU HAD AN ENDOSCOPIC PROCEDURE TODAY AT [facility]: Refer to the procedure report that was given to you for any specific questions about what was found during the examination.  If the procedure report does not answer your questions, please call your gastroenterologist to clarify.  If you requested that your care partner not be given the details of your procedure findings, then the procedure report has been included in a sealed envelope for you to review at your convenience later.  YOU SHOULD EXPECT: Some feelings of bloating in the abdomen. Passage of more gas than usual.  Walking can help get rid of the air that was put into your GI tract during the procedure and reduce the bloating. If you had a lower endoscopy (such as a colonoscopy or flexible sigmoidoscopy) you may notice spotting of blood in your stool or on the toilet paper. If you underwent a bowel prep for your procedure, then you may not have a normal bowel movement for a few days.  DIET: FOLLOW DILATION DIET- SEE HANDOUT  Drink plenty of fluids but you should avoid alcoholic beverages for 24 hours.  ACTIVITY: Your care partner should take you home directly after the procedure.  You should plan to take it easy, moving slowly for the rest of the day.  You can resume normal activity the day after the procedure however you should NOT DRIVE or use heavy machinery for 24 hours (because of the sedation medicines used during the test).    SYMPTOMS TO REPORT IMMEDIATELY: A gastroenterologist can be reached at any hour.  During normal business hours, 8:30 AM to 5:00 PM Monday through Friday, call [phone].  After hours and on weekends, please call the GI answering service at [phone] who will take a message and have the physician on call contact you.   Following lower endoscopy (colonoscopy or flexible sigmoidoscopy):  Excessive amounts of blood in the stool  Significant tenderness or worsening of abdominal  pains  Swelling of the abdomen that is new, acute  Fever of 100F or higher  Following upper endoscopy (EGD)  Vomiting of blood or coffee ground material  New chest pain or pain under the shoulder blades  Painful or persistently difficult swallowing  New shortness of breath  Fever of 100F or higher  Black, tarry-looking stools  FOLLOW UP: If any biopsies were taken you will be contacted by phone or by letter within the next 1-3 weeks.  Call your gastroenterologist if you have not heard about the biopsies in 3 weeks.  Our staff will call the home number listed on your records the next business day following your procedure to check on you and address any questions or concerns that you may have at that time regarding the information given to you following your procedure. This is a courtesy call and so if there is no answer at the home number and we have not heard from you through the emergency physician on call, we will assume that you have returned to your regular daily activities without incident.  SIGNATURES/CONFIDENTIALITY: You and/or your care partner have signed paperwork which will be entered into your electronic medical record.  These signatures attest to the fact that that the information above on your After Visit Summary has been reviewed and is understood.  Full responsibility of the confidentiality of this discharge information lies with you and/or your care-partner.  Await pathology  Continue your normal medications  Please read over handout about polyps and esophagitis

## 2014-06-24 NOTE — Op Note (Signed)
[place]  [address]. [place], [address]   ENDOSCOPY WITH DILATION PROCEDURE REPORT  PATIENT: Angelica, Wells  MR#: #[id number] BIRTHDATE: [birth year] , 53  yrs. old GENDER: female ENDOSCOPIST: Inda Castle, MD ASSISTANT: REFERRED BY: PROCEDURE DATE:  06/24/2014 PROCEDURE:   EGD w/ biopsy and Maloney dilation of esophagus ASA CLASS:   Class II INDICATIONS:dysphagia. MEDICATIONS: Residual sedation present, Monitored anesthesia care, and Propofol 170 mg IV TOPICAL ANESTHETIC:  DESCRIPTION OF PROCEDURE:   After the risks benefits and alternatives of the procedure were thoroughly explained, informed consent was obtained.  The LB [id number]  endoscope was introduced through the mouth  and advanced to the second portion of the duodenum , limited by Without limitations.   The instrument was slowly withdrawn as the mucosa was carefully examined.      ESOPHAGUS: There was a benign appearing stricture at the gastroesophageal junction.  The stricture was easily traversable. The stricture was dilated using a 51mm (52Fr) Maloney dilator. Eosinophilic esophagitis suggested by the presence oflongitudinal furrows.  Multiple biopsies were taken.  Dilation was then performed at the gastroesophageal junction  Dilator:Maloney Size:52Fr .  Minimal resistance.  Trace heme  COMPLICATIONS: There were no immediate complications.  ENDOSCOPIC IMPRESSION: 1.   There was a stricture at the gastroesophageal junction; The stricture was dilated using a 68mm (52Fr) Maloney dilator 2.   Question eosinophilic esophagitis  RECOMMENDATIONS: Await biopsy results  eSigned:  Inda Castle, MD 06/24/2014 4:09 PM  CC: Reita Cliche, MD  CPT CODES: ICD CODES:  The ICD and CPT codes recommended by this software are interpretations from the data that the clinical staff has captured with the software.  The verification of the translation of this report to the ICD and CPT  codes and modifiers is the sole responsibility of the health care institution and practicing physician where this report was generated.  [employer] will not be held responsible for the validity of the ICD and CPT codes included on this report.  AMA assumes no liability for data contained or not contained herein. CPT is a Designer, television/film set of the [employer].  PATIENT NAME:  Angelica, Wells MR#: #[id number]

## 2014-06-24 NOTE — Progress Notes (Signed)
Called to room to assist during endoscopic procedure.  Patient ID and intended procedure confirmed with present staff. Received instructions for my participation in the procedure from the performing physician.  

## 2014-06-27 ENCOUNTER — Telehealth: Payer: Self-pay

## 2014-06-27 NOTE — Telephone Encounter (Signed)
No answer, left vm 

## 2014-07-03 ENCOUNTER — Encounter: Payer: Self-pay | Admitting: Gastroenterology

## 2014-07-05 ENCOUNTER — Telehealth: Payer: Self-pay | Admitting: *Deleted

## 2014-07-05 DIAGNOSIS — K200 Eosinophilic esophagitis: Secondary | ICD-10-CM

## 2014-07-06 MED ORDER — FLUTICASONE FUROATE 200 MCG/ACT IN AEPB: 200.0000 ug | INHALATION_SPRAY | Freq: Two times a day (BID) | RESPIRATORY_TRACT | Status: DC

## 2014-07-06 NOTE — Telephone Encounter (Signed)
Dr Deatra Ina,  Sorry to bother you again- could you clarify the Fluticasone RX?  Also, your next OV is February 12th- is that ok for her OV?  Cyril Mourning

## 2014-07-06 NOTE — Telephone Encounter (Signed)
LMOM re: medication.  Detailed message left on machine and told pt to call office back to set up follow up visit.  RX sent to pt's pharmacy

## 2014-07-06 NOTE — Telephone Encounter (Signed)
       fluticaxone 2 puffs swallowed (not inhaled); NPO for 1/2 hr after each dose       Previous Messages     ----- Message -----   From: Laverna Peace, RN   Sent: 07/05/2014  4:07 PM    To: Inda Castle, MD   Also, I just want to make sure I'm prescribing the right medication Is it Flovent 220? Or Fluticasone Furoate 24mcg/act aepb?   ----- Message -----   From: Inda Castle, MD   Sent: 07/05/2014  3:06 PM    To: Laverna Peace, RN   At least one month. She needs a follow-up office visit  ----- Message -----   From: Laverna Peace, RN   Sent: 07/05/2014  9:40 AM    To: Inda Castle, MD   Dr. Deatra Ina,   How long does Ms Heal have to do the Fluticasone 200 ug 2 puffs BID for?   Cyril Mourning

## 2014-07-12 NOTE — Telephone Encounter (Signed)
See 05/09/14 phone note

## 2014-08-26 ENCOUNTER — Ambulatory Visit (INDEPENDENT_AMBULATORY_CARE_PROVIDER_SITE_OTHER): Payer: 59 | Admitting: Gastroenterology

## 2014-08-26 ENCOUNTER — Encounter: Payer: Self-pay | Admitting: Gastroenterology

## 2014-08-26 VITALS — BP 130/80 | HR 68 | Ht 61.75 in | Wt 178.4 lb

## 2014-08-26 DIAGNOSIS — Z860100 Personal history of colon polyps, unspecified: Secondary | ICD-10-CM | POA: Insufficient documentation

## 2014-08-26 DIAGNOSIS — K200 Eosinophilic esophagitis: Secondary | ICD-10-CM

## 2014-08-26 NOTE — Progress Notes (Signed)
      History of Present Illness:  Angelica Wells has returned following upper and lower endoscopy.  An early esophageal stricture and findings compatible with eosinophilic esophagitis were noted.  Biopsies confirmed the diagnosis.  An adenomatous polyp was removed.  She took fluticasone for one month with relief of her dysphagia.  She  occasionally has difficulty initiating a swallow.  She has no known allergies.  Incidentally, her chronic nonproductive cough has entirely resolved since taking fluticasone.    Review of Systems: Pertinent positive and negative review of systems were noted in the above HPI section. All other review of systems were otherwise negative.    Current Medications, Allergies, Past Medical History, Past Surgical History, Family History and Social History were reviewed in [facility] record  Vital signs were reviewed in today's medical record. Physical Exam: General: Well developed , well nourished, no acute distress   See Assessment and Plan under Problem List

## 2014-08-26 NOTE — Assessment & Plan Note (Signed)
His copper gated by an early stricture which was dilated.  She's had no recurrence of symptoms since dilatation in one month the fluticasone.  Recommendations #1 referred to allergy for testing #2 continue Protonix #3 resume Latuda sound if symptoms recur

## 2014-08-26 NOTE — Assessment & Plan Note (Signed)
In follow up colonoscopy 2020

## 2014-08-26 NOTE — Patient Instructions (Signed)
We have referred you to see Keturah Barre MD 2nd floor [facility]    Your appointment is scheduled on 10/28/2014 at 10am

## 2014-10-26 ENCOUNTER — Ambulatory Visit: Payer: Self-pay | Admitting: Gynecology

## 2014-10-28 ENCOUNTER — Institutional Professional Consult (permissible substitution): Payer: 59 | Admitting: Internal Medicine

## 2014-12-28 ENCOUNTER — Encounter: Payer: Self-pay | Admitting: Gastroenterology

## 2015-03-08 ENCOUNTER — Telehealth: Payer: Self-pay | Admitting: Obstetrics and Gynecology

## 2015-03-08 NOTE — Telephone Encounter (Signed)
Patient is having some menopausal issues and would like to discuss this with the nurse.

## 2015-03-08 NOTE — Telephone Encounter (Signed)
Spoke with patient. Patient states that she has been using Estring for vaginal dryness without relief. Patient would like to discuss alternative options. Last aex was 10/20/2013. Advised will need to be seen for aex and discussion of alternative medications. Patient is agreeable. Appointment scheduled for 03/05/2015 at 1:15pm with Dr.Jerston. Patient is agreeable and verbalizes understanding.  Routing to provider for final review. Patient agreeable to disposition. Will close encounter.   Patient aware provider will review message and nurse will return call if any additional advice or change of disposition.

## 2015-03-08 NOTE — Telephone Encounter (Signed)
Left message to call Haelyn Forgey at [phone]. 

## 2015-03-15 ENCOUNTER — Encounter: Payer: Self-pay | Admitting: Obstetrics and Gynecology

## 2015-03-15 ENCOUNTER — Ambulatory Visit (INDEPENDENT_AMBULATORY_CARE_PROVIDER_SITE_OTHER): Payer: 59 | Admitting: Obstetrics and Gynecology

## 2015-03-15 VITALS — BP 116/70 | HR 64 | Resp 16 | Ht 62.0 in | Wt 187.0 lb

## 2015-03-15 DIAGNOSIS — Z2839 Other underimmunization status: Secondary | ICD-10-CM

## 2015-03-15 DIAGNOSIS — Z9289 Personal history of other medical treatment: Secondary | ICD-10-CM

## 2015-03-15 DIAGNOSIS — E049 Nontoxic goiter, unspecified: Secondary | ICD-10-CM | POA: Diagnosis not present

## 2015-03-15 DIAGNOSIS — Z23 Encounter for immunization: Secondary | ICD-10-CM | POA: Diagnosis not present

## 2015-03-15 DIAGNOSIS — Z9189 Other specified personal risk factors, not elsewhere classified: Secondary | ICD-10-CM

## 2015-03-15 DIAGNOSIS — N952 Postmenopausal atrophic vaginitis: Secondary | ICD-10-CM

## 2015-03-15 DIAGNOSIS — Z01419 Encounter for gynecological examination (general) (routine) without abnormal findings: Secondary | ICD-10-CM | POA: Diagnosis not present

## 2015-03-15 LAB — THYROID PANEL WITH TSH
FREE THYROXINE INDEX: 2.3 (ref 1.4–3.8)
T3 Uptake: 30 % (ref 22–35)
T4, Total: 7.5 ug/dL (ref 4.5–12.0)
TSH: 1.54 u[IU]/mL (ref 0.350–4.500)

## 2015-03-15 MED ORDER — ESTRADIOL 0.1 MG/GM VA CREA: TOPICAL_CREAM | VAGINAL | Status: DC

## 2015-03-15 NOTE — Progress Notes (Signed)
Patient ID: Angelica Wells, female   DOB: 11-May-1961, 54 y.o.   MRN: [id number] 54 y.o. [id number] MarriedCaucasianF here for annual exam. Patient c/o vaginal dryness and painful intercourse. In the past she had minimal help with the vagifem or estring. Lubricant helps minimally. Intermittent vulvar itching, no change, currently fine.  H/O endometrial ablation over 10 years ago, no bleeding since then. She is still having vasomotor symptoms, improving.   No LMP recorded. Patient has had an ablation.          Sexually active: Yes.    The current method of family planning is post menopausal status.    Exercising: No.  The patient does not participate in regular exercise at present. Smoker:  no  Health Maintenance: Pap:  10-20-13 WNL NEG HR HPV  History of abnormal Pap:  no MMG:  10-26-13 WNL  Colonoscopy:  06-24-14 WNL  BMD:   Never  TDaP:  12-29-03    reports that she has never smoked. She has never used smokeless tobacco. She reports that she drinks alcohol. She reports that she does not use illicit drugs.  Past Medical History  Diagnosis Date  . Hypertension   . GERD (gastroesophageal reflux disease)   . Aortic regurgitation   . Migraine without aura 05/2005  . Esophageal stricture   . Exercise-induced asthma   . Eczema   . History of uterine fibroid     ut10x7x6 cm   . Eosinophilic esophagitis 05/39    Past Surgical History  Procedure Laterality Date  . Breast reduction surgery    . Uterine ablation    . Myomectomy  1995  . Cesarean section  1996, 1998  . Tonsillectomy    . Abdominoplasty  2000  . Ablation  01/06/2004    HTA    Current Outpatient Prescriptions  Medication Sig Dispense Refill  . atenolol (TENORMIN) 100 MG tablet TAKE 1 TABLET DAILY 90 tablet 0  . calcium-vitamin D (OSCAL WITH D) 500-200 MG-UNIT per tablet Take 1 tablet by mouth daily.      . fluocinonide ointment (LIDEX) 0.05 % Apply topically 2 (two) times daily. (Patient taking differently: Apply 1  application topically as needed. ) 30 g 0  . lisinopril-hydrochlorothiazide (PRINZIDE,ZESTORETIC) 10-12.5 MG per tablet Take 1 tablet by mouth daily.    . pantoprazole (PROTONIX) 40 MG tablet TAKE 1 TABLET DAILY 90 tablet 2  . zolmitriptan (ZOMIG) 5 MG tablet Take 1 tablet (5 mg total) by mouth as needed for migraine. 24 tablet 0  . cetirizine (ZYRTEC) 10 MG tablet Take 10 mg by mouth as needed.      No current facility-administered medications for this visit.    Family History  Problem Relation Age of Onset  . Hypertension Mother   . Bladder Cancer Father   . Other Mother     mvp, bicuspid aortic valve cabg, valve replacement  . Migraines Mother     went away after menopause  . Breast cancer Maternal Grandmother 22  . Hypertension Father     ROS:  Pertinent items are noted in HPI.  Otherwise, a comprehensive ROS was negative.  Exam:   BP 116/70 mmHg  Pulse 64  Resp 16  Ht 5\' 2"  (1.575 m)  Wt 187 lb (84.823 kg)  BMI 34.19 kg/m2  Weight change: @WEIGHTCHANGE @ Height:   Height: 5\' 2"  (157.5 cm)  Ht Readings from Last 3 Encounters:  03/15/15 5\' 2"  (1.575 m)  08/26/14 5' 1.75" (1.568 m)  06/24/14 5\' 2"  (  1.575 m)    General appearance: alert, cooperative and appears stated age Head: Normocephalic, without obvious abnormality, atraumatic Neck: no adenopathy, supple, symmetrical, trachea midline and thyroid enlarged on the right lobe compared to the left Lungs: clear to auscultation bilaterally Breasts: normal appearance, no masses or tenderness Heart: regular rate and rhythm Abdomen: soft, non-tender; bowel sounds normal; no masses,  no organomegaly Extremities: extremities normal, atraumatic, no cyanosis or edema Skin: Skin color, texture, turgor normal. No rashes or lesions Lymph nodes: Cervical, supraclavicular, and axillary nodes normal. No abnormal inguinal nodes palpated Neurologic: Grossly normal   Pelvic: External genitalia:  no lesions              Urethra:   normal appearing urethra with no masses, tenderness or lesions              Bartholins and Skenes: normal                 Vagina: normal appearing vagina with mild atrophy. Slight discomfort with insertion of 2 vaginal fingers              Cervix: no lesions              Pap taken: No. Bimanual Exam:  Uterus:  normal size, contour, position, consistency, mobility, non-tender and retroverted              Adnexa: normal adnexa and no mass, fullness, tenderness               Rectovaginal: Confirms               Anus:  normal sphincter tone, no lesions  Chaperone was present for exam.  A:  Well Woman with normal exam  Vaginal atrophy with some entry dyspareunia  Right thyroid lobe enlarged compared to the left  P:   No pap this year  Patient will set up a mammogram  Start estrace vaginal cream, use a lubricant with intercourse, f/u if not helping. Briefly discussed vaginal dilators  TDAP today  Labs with primary  Calcium with vit d recommended  Discussed breast self exam  TFT's  Thyroid ultrasound

## 2015-03-15 NOTE — Patient Instructions (Addendum)

## 2015-03-16 ENCOUNTER — Telehealth: Payer: Self-pay | Admitting: Obstetrics and Gynecology

## 2015-03-16 ENCOUNTER — Telehealth: Payer: Self-pay

## 2015-03-16 MED ORDER — ESTRADIOL 0.1 MG/GM VA CREA: TOPICAL_CREAM | VAGINAL | Status: DC

## 2015-03-16 NOTE — Telephone Encounter (Signed)
Spoke with patient. Advised of results as seen below. Patient is agreeable. Patient would like to check with her insurance company regarding costs and where it would be best for her to have ultrasound performed and return call to let us know for scheduling.

## 2015-03-16 NOTE — Telephone Encounter (Signed)
Patient would like prescription for the Estrace sent to [facility] in [place] at [phone].

## 2015-03-16 NOTE — Telephone Encounter (Signed)
03/15/15 #42.5 GM/2 rfs was sent to [facility]  Estrace Vaginal Cream #42.5 gm/2 rfs sent to [facility] in [place].  Patient aware that rx has been sent to pharmacy.  Routed to provider for review, encounter closed.

## 2015-03-16 NOTE — Telephone Encounter (Signed)
Left message to call Kaitlyn at [phone]. 

## 2015-03-16 NOTE — Telephone Encounter (Signed)
-----   Message from Salvadore Dom, MD sent at 03/16/2015 11:08 AM EDT ----- Please inform the patient that her TFT's are normal and make sure her thyroid ultrasound is set up. Thanks, Sharee Pimple

## 2015-03-28 ENCOUNTER — Telehealth: Payer: Self-pay | Admitting: Gastroenterology

## 2015-03-28 NOTE — Telephone Encounter (Signed)
She should talk to her PCP or Dr. Annamaria Boots regarding an inhaler

## 2015-03-28 NOTE — Telephone Encounter (Signed)
Spoke with patient. Patient states that she forgot to contact her insurance company regarding coverage locations for thyroid ultrasound. Patient states she will contact her insurance company at this time and return call with further information to get scheduled.

## 2015-03-28 NOTE — Telephone Encounter (Signed)
Dr Deatra Ina, Patient wants an inhaler called in for her. We referred her to Dr Annamaria Boots and she cancelled her appointment. Do you want to send her in the inhaler. There is no inhaler prescribed on her list

## 2015-04-04 NOTE — Telephone Encounter (Signed)
Left message to call Kaitlyn at [phone]. 

## 2015-04-07 NOTE — Telephone Encounter (Signed)
Left message to call Kaitlyn at [phone]. 

## 2015-04-11 NOTE — Telephone Encounter (Signed)
Letter written and to your desk for signature.

## 2015-04-11 NOTE — Telephone Encounter (Signed)
Dr.Jertson, I have attempted to reach this patient multiple times regarding scheduling of thyroid ultrasound. Okay to send letter at this time?

## 2015-04-11 NOTE — Telephone Encounter (Signed)
Yes please, explain that it is important to evaluate an enlarged thyroid, it can be a sign of cancer (most of the time it is benign).

## 2015-04-12 NOTE — Telephone Encounter (Signed)
Letter sent to patient's home address on file.  Routing to provider for final review. Patient agreeable to disposition. Will close encounter.

## 2015-04-17 ENCOUNTER — Telehealth: Payer: Self-pay | Admitting: Obstetrics and Gynecology

## 2015-04-17 NOTE — Telephone Encounter (Signed)
Left message to call Vineet Kinney at [phone]. 

## 2015-04-17 NOTE — Telephone Encounter (Signed)
Patient called and said, "I want to schedule my thyroid ultrasound at [facility]."  She gave this phone number for [facility]: [phone].

## 2015-04-18 NOTE — Telephone Encounter (Signed)
Continued imaging hold.

## 2015-04-18 NOTE — Telephone Encounter (Signed)
Spoke with patient. Patient states she is available to have thyroid ultrasound on 10/10, 10/12, and 10/13. Advised I will call [facility] and schedule appointment for her and return call with appointment date and time. Patient is agreeable. Spoke with Bethena Roys at [facility] appointment for thyroid ultrasound scheduled for 10/10 at 11:30 am with 11:15 am arrival. Order to [facility] for review and signature. Will need to be faxed to [phone]. Signed order faxed with cover sheet and confirmation.  Left detailed message at number provided by patient [phone]. Advised of appointment date, time, and location. Advised to return call with any further questions.  Cc: Karen Chafe, RN for imaging hold  Routing to provider for final review. Patient agreeable to disposition. Will close encounter.

## 2015-04-26 ENCOUNTER — Telehealth: Payer: Self-pay | Admitting: Obstetrics and Gynecology

## 2015-04-26 NOTE — Telephone Encounter (Signed)
Message left to return call to [facility] at [phone].   Out of imaging hold.

## 2015-04-26 NOTE — Telephone Encounter (Signed)
Please inform the patient that her thyroid ultrasound showed a few small cysts. No nodules, nothing of concern. No f/u needed. Please take her chart out of hold.

## 2015-04-28 NOTE — Telephone Encounter (Signed)
Patient returning your call. Patient is asking if you could leave a detailed message at this number [phone] because she is at work and unable to answer.

## 2015-04-28 NOTE — Telephone Encounter (Signed)
Returned call to patient and spoke with her. Message from Dr. Talbert Nan given and patient agreeable and verbalized understanding. Will follow up as needed.  Routing to provider for final review. Patient agreeable to disposition. Will close encounter.

## 2015-06-13 ENCOUNTER — Other Ambulatory Visit: Payer: Self-pay | Admitting: Obstetrics and Gynecology

## 2015-06-14 NOTE — Telephone Encounter (Signed)
Medication refill request: Estrace Vaginal Cream 43.5 gm Last AEX:  03/15/2015 JJ Next AEX: Not scheduled Last MMG (if hormonal medication request): 10/26/2013 BIRADS 2 BENIGN Refill authorized:03/16/2015 Estrace Vaginal Cream 42.5 gm 2 Refills  Today: Estrace Vaginal Cream 2 refills ?

## 2016-04-15 ENCOUNTER — Encounter: Payer: Self-pay | Admitting: Obstetrics and Gynecology

## 2016-04-15 ENCOUNTER — Ambulatory Visit (INDEPENDENT_AMBULATORY_CARE_PROVIDER_SITE_OTHER): Payer: 59 | Admitting: Obstetrics and Gynecology

## 2016-04-15 VITALS — BP 118/70 | HR 80 | Resp 14 | Ht 62.0 in | Wt 170.0 lb

## 2016-04-15 DIAGNOSIS — N941 Unspecified dyspareunia: Secondary | ICD-10-CM

## 2016-04-15 DIAGNOSIS — N952 Postmenopausal atrophic vaginitis: Secondary | ICD-10-CM | POA: Diagnosis not present

## 2016-04-15 DIAGNOSIS — Z01419 Encounter for gynecological examination (general) (routine) without abnormal findings: Secondary | ICD-10-CM | POA: Diagnosis not present

## 2016-04-15 MED ORDER — ESTRADIOL 0.1 MG/GM VA CREA: TOPICAL_CREAM | VAGINAL | 1 refills | Status: DC

## 2016-04-15 NOTE — Patient Instructions (Signed)

## 2016-04-15 NOTE — Progress Notes (Signed)
55 y.o. [id number] MarriedCaucasianF here for annual exam.  Postmenopausal no bleeding. She is using the vaginal cream 2 x a week, has helped her discomfort with intercourse. She is using a lubricant which makes it tolerable. Declines dilators.  Last year she was noted to have enlargement of the right side of her thyroid, imaging and labs were normal.     No LMP recorded. Patient has had an ablation.          Sexually active: Yes.    The current method of family planning is none.    Exercising: No.  The patient does not participate in regular exercise at present. Smoker:  no  Health Maintenance: Pap:  10-20-13 WNL NEG HR HPV History of abnormal Pap:  no MMG:  2017 WNL per patient  Colonoscopy:  06-24-14 polyp  BMD:   Never TDaP:  03-15-15 Gardasil: N/A   reports that she has never smoked. She has never used smokeless tobacco. She reports that she drinks about 1.2 oz of alcohol per week . She reports that she does not use drugs. Kids are both in college. Homemaker.  Past Medical History:  Diagnosis Date  . Aortic regurgitation   . Eczema   . Eosinophilic esophagitis [id number]  . Esophageal stricture   . Exercise-induced asthma   . GERD (gastroesophageal reflux disease)   . History of uterine fibroid    ut10x7x6 cm   . Hypertension   . Migraine without aura 05/2005    Past Surgical History:  Procedure Laterality Date  . ABDOMINOPLASTY  2000  . ABLATION  01/06/2004   HTA  . BREAST REDUCTION SURGERY    . [place]  . MYOMECTOMY  1995  . TONSILLECTOMY    . uterine ablation      Current Outpatient Prescriptions  Medication Sig Dispense Refill  . atenolol (TENORMIN) 100 MG tablet TAKE 1 TABLET DAILY 90 tablet 0  . cetirizine (ZYRTEC) 10 MG tablet Take 10 mg by mouth as needed.     Marland Kitchen estradiol (ESTRACE VAGINAL) 0.1 MG/GM vaginal cream Use 1 gram per vagina 2 x a week at bedtime 42.5 g 1  . fluocinonide ointment (LIDEX) 0.05 % Apply topically 2 (two) times daily.  (Patient taking differently: Apply 1 application topically as needed. ) 30 g 0  . lisinopril-hydrochlorothiazide (PRINZIDE,ZESTORETIC) 10-12.5 MG per tablet Take 1 tablet by mouth daily.    . pantoprazole (PROTONIX) 40 MG tablet TAKE 1 TABLET DAILY 90 tablet 2  . zolmitriptan (ZOMIG) 5 MG tablet Take 1 tablet (5 mg total) by mouth as needed for migraine. 24 tablet 0  . calcium-vitamin D (OSCAL WITH D) 500-200 MG-UNIT per tablet Take 1 tablet by mouth daily.       No current facility-administered medications for this visit.     Family History  Problem Relation Age of Onset  . Hypertension Mother   . Bladder Cancer Father   . Other Mother     mvp, bicuspid aortic valve cabg, valve replacement  . Migraines Mother     went away after menopause  . Breast cancer Maternal Grandmother 98  . Hypertension Father     Review of Systems  Constitutional: Negative.   HENT: Negative.   Eyes: Negative.   Respiratory: Negative.   Cardiovascular: Negative.   Gastrointestinal: Negative.   Endocrine: Negative.   Genitourinary: Negative.   Musculoskeletal: Negative.   Skin: Negative.   Allergic/Immunologic: Negative.   Neurological: Negative.   Psychiatric/Behavioral: Negative.  Exam:   BP 118/70 (BP Location: Right Arm, Patient Position: Sitting, Cuff Size: Normal)   Pulse 80   Resp 14   Ht 5\' 2"  (1.575 m)   Wt 170 lb (77.1 kg)   BMI 31.09 kg/m   Weight change: @WEIGHTCHANGE @ Height:   Height: 5\' 2"  (157.5 cm)  Ht Readings from Last 3 Encounters:  04/15/16 5\' 2"  (1.575 m)  03/15/15 5\' 2"  (1.575 m)  08/26/14 5' 1.75" (1.568 m)    General appearance: alert, cooperative and appears stated age Head: Normocephalic, without obvious abnormality, atraumatic Neck: no adenopathy, supple, symmetrical, trachea midline and thyroid normal to inspection and palpation  (slightly larger on the right, negative imaging last year) Lungs: clear to auscultation bilaterally Breasts: normal appearance,  no masses or tenderness Heart: regular rate and rhythm Abdomen: soft, non-tender; bowel sounds normal; no masses,  no organomegaly Extremities: extremities normal, atraumatic, no cyanosis or edema Skin: Skin color, texture, turgor normal. No rashes or lesions Lymph nodes: Cervical, supraclavicular, and axillary nodes normal. No abnormal inguinal nodes palpated Neurologic: Grossly normal   Pelvic: External genitalia:  no lesions              Urethra:  normal appearing urethra with no masses, tenderness or lesions              Bartholins and Skenes: normal                 Vagina: normal appearing vagina with normal color and discharge, no lesions              Cervix: no lesions               Bimanual Exam:  Uterus:  normal size, contour, position, consistency, mobility, non-tender and retroverted              Adnexa: no mass, fullness, tenderness               Rectovaginal: Confirms               Anus:  normal sphincter tone, no lesions  Chaperone was present for exam.  A:  Well Woman with normal exam  Vaginal atrophy, helped with estrace cream  Dyspareunia, improved, still uncomfortable  P:   She is establishing care with a new primary MD, will do her labs there  Pap next year  Mammogram is UTD  Continue estrace cream  Continue to use a vaginal lubricant (discussed different options), she should control the rate and depth of penetration

## 2016-04-23 ENCOUNTER — Telehealth: Payer: Self-pay | Admitting: General Practice

## 2016-04-23 NOTE — Telephone Encounter (Signed)
PT would like to re-est with dr Regis Bill. Pt last seen md 2013

## 2016-04-25 NOTE — Telephone Encounter (Signed)
IF she has not been  Under care  From another  PCP    Can reestablish    Otherwise inform her that my practice is Not taking new patients

## 2016-04-30 NOTE — Telephone Encounter (Signed)
Pt has called wanting to know the status to see if Dr. Regis Bill will see her.

## 2016-04-30 NOTE — Telephone Encounter (Signed)
Pt has been scheduled with Dr Jordan.  

## 2016-05-03 ENCOUNTER — Encounter: Payer: Self-pay | Admitting: Family Medicine

## 2016-05-03 ENCOUNTER — Ambulatory Visit (INDEPENDENT_AMBULATORY_CARE_PROVIDER_SITE_OTHER): Payer: 59 | Admitting: Family Medicine

## 2016-05-03 VITALS — BP 126/80 | HR 78 | Resp 12 | Ht 62.0 in | Wt 172.1 lb

## 2016-05-03 DIAGNOSIS — K219 Gastro-esophageal reflux disease without esophagitis: Secondary | ICD-10-CM | POA: Diagnosis not present

## 2016-05-03 DIAGNOSIS — L301 Dyshidrosis [pompholyx]: Secondary | ICD-10-CM | POA: Diagnosis not present

## 2016-05-03 DIAGNOSIS — Z23 Encounter for immunization: Secondary | ICD-10-CM | POA: Diagnosis not present

## 2016-05-03 DIAGNOSIS — G43C0 Periodic headache syndromes in child or adult, not intractable: Secondary | ICD-10-CM

## 2016-05-03 DIAGNOSIS — I10 Essential (primary) hypertension: Secondary | ICD-10-CM | POA: Diagnosis not present

## 2016-05-03 LAB — BASIC METABOLIC PANEL
BUN: 15 mg/dL (ref 6–23)
CHLORIDE: 104 meq/L (ref 96–112)
CO2: 30 mEq/L (ref 19–32)
CREATININE: 0.71 mg/dL (ref 0.40–1.20)
Calcium: 10.1 mg/dL (ref 8.4–10.5)
GFR: 91.14 mL/min (ref >60.00)
Glucose, Bld: 88 mg/dL (ref 70–99)
POTASSIUM: 4.2 meq/L (ref 3.5–5.1)
Sodium: 140 mEq/L (ref 135–145)

## 2016-05-03 MED ORDER — ATENOLOL 100 MG PO TABS: 100.0000 mg | ORAL_TABLET | Freq: Every day | ORAL | 2 refills | Status: DC

## 2016-05-03 MED ORDER — ZOLMITRIPTAN 5 MG PO TABS: 5.0000 mg | ORAL_TABLET | ORAL | 3 refills | Status: DC | PRN

## 2016-05-03 MED ORDER — CLOBETASOL PROPIONATE 0.05 % EX CREA: 1.0000 "application " | TOPICAL_CREAM | Freq: Two times a day (BID) | CUTANEOUS | 1 refills | Status: DC

## 2016-05-03 MED ORDER — LISINOPRIL-HYDROCHLOROTHIAZIDE 10-12.5 MG PO TABS: 1.0000 | ORAL_TABLET | Freq: Every day | ORAL | 2 refills | Status: DC

## 2016-05-03 NOTE — Progress Notes (Signed)
Pre visit review using our clinic review tool, if applicable. No additional management support is needed unless otherwise documented below in the visit note. 

## 2016-05-03 NOTE — Progress Notes (Signed)
HPI:   Angelica Wells is a 55 y.o. female, who is here today to establish care with me.  Former PCP: Dr Belenda Cruise, [place], [place] Last preventive routine visit: 2016.  Concerns today: eczema.  Reporting history of hand eczema, which she has suffered for years, currently she is been using OTC steroid ointment, Eucerin eczema, and moisturizers but they do not seem to be helping. Getting worse.  + Erythema, scaly palms, bilateral.  She has not identified exacerbating factors. She has no other rash elsewhere.  She has seen dermatologists in the past.  GERD: Currently she is on Protonix 40 mg, which is not taking daily, trying to decrease dose and/or skipping days. Symptoms have improved with dietary changes. In the past she has had esophageal strictures, has undergone esophageal dilations.  She denies dysphasia, odynophagia, cough,abdominal pain, nausea, vomiting, changes in bowel habits, blood in stool or melena.   Migraines:  Dx at age 41. Currently she is on Zolmitriptan 5 mg 1 or 2 tablets daily as needed. She is reporting episodes of migraine from 3-10 per month, symptoms seem to be aggravated by hormonal" changes and weather changes. Headache is usually right hemicranial, associated nausea and photophobia. Sometimes she also has headaches that start on occipital area and radiates to frontal area. Headache is not preceed by visual aura. Headache and last max 2 days but if she takes medication upon acute onset it lasts a few hours. She has been evaluated by neurologists in the past when she was diagnosed. She denies any new associated symptoms or changes in headache presentation.   Hypertension:   Dx at 55 yo. Currently on Atenolol 100 mg daily and lisinopril HCTZ 10/12.5 mg daily.   She taking medications as instructed, no side effects reported.  Denies visual changes, exertional chest pain, dyspnea,  focal weakness, or edema.    Lab Results  Component  Value Date   CREATININE 0.7 07/26/2011   BUN 15 07/26/2011   NA 144 07/26/2011   K 4.2 07/26/2011   CL 108 07/26/2011   CO2 27 07/26/2011   She tries to follow a healthful diet, does not exercise regularly.  She follows with gynecology regularly for her female preventive care.   Review of Systems  Constitutional: Negative for activity change, appetite change, fatigue, fever and unexpected weight change.  HENT: Negative for mouth sores, nosebleeds and trouble swallowing.   Eyes: Negative for redness and visual disturbance.  Respiratory: Negative for cough, shortness of breath and wheezing.   Cardiovascular: Negative for chest pain, palpitations and leg swelling.  Gastrointestinal: Negative for abdominal pain, nausea and vomiting.       Negative for changes in bowel habits.  Genitourinary: Negative for decreased urine volume, difficulty urinating, dysuria and hematuria.  Musculoskeletal: Negative for gait problem and myalgias.  Skin: Positive for rash. Negative for wound.  Allergic/Immunologic: Positive for environmental allergies.  Neurological: Positive for headaches. Negative for syncope, facial asymmetry, speech difficulty, weakness and numbness.  Hematological: Negative for adenopathy. Does not bruise/bleed easily.  Psychiatric/Behavioral: Negative for confusion and sleep disturbance. The patient is not nervous/anxious.       Current Outpatient Prescriptions on File Prior to Visit  Medication Sig Dispense Refill  . calcium-vitamin D (OSCAL WITH D) 500-200 MG-UNIT per tablet Take 1 tablet by mouth daily.      . cetirizine (ZYRTEC) 10 MG tablet Take 10 mg by mouth as needed.     Marland Kitchen estradiol (ESTRACE VAGINAL) 0.1 MG/GM vaginal  cream Use 1 gram per vagina 2 x a week at bedtime 42.5 g 1  . pantoprazole (PROTONIX) 40 MG tablet TAKE 1 TABLET DAILY 90 tablet 2   No current facility-administered medications on file prior to visit.      Past Medical History:  Diagnosis Date  .  Aortic regurgitation   . Eczema   . Eosinophilic esophagitis [id number]  . Esophageal stricture   . Exercise-induced asthma   . GERD (gastroesophageal reflux disease)   . History of uterine fibroid    ut10x7x6 cm   . Hypertension   . Migraine without aura 05/2005   Allergies  Allergen Reactions  . Sulfonamide Derivatives Other (See Comments)    Made feel out of control like couldn't function    Family History  Problem Relation Age of Onset  . Hypertension Mother   . Other Mother     mvp, bicuspid aortic valve cabg, valve replacement  . Migraines Mother     went away after menopause  . Bladder Cancer Father   . Hypertension Father   . Breast cancer Maternal Grandmother 59    Social History   Social History  . Marital status: Married    Spouse name: N/A  . Number of children: N/A  . Years of education: N/A   Social History Main Topics  . Smoking status: Never Smoker  . Smokeless tobacco: Never Used  . Alcohol use 1.2 oz/week    2 Standard drinks or equivalent per week  . Drug use: No  . Sexual activity: Yes    Partners: Male   Other Topics Concern  . None   Social History Narrative   Married   Regular exercise- no   2 caffeine   HH of 4   No pet   BS degree   Work in Futures trader    Vitals:   05/03/16 0851  BP: 126/80  Pulse: 78  Resp: 12    Body mass index is 31.48 kg/m.    Physical Exam  Nursing note and vitals reviewed. Constitutional: She is oriented to person, place, and time. She appears well-developed. No distress.  HENT:  Head: Atraumatic.  Mouth/Throat: Oropharynx is clear and moist and mucous membranes are normal.  Eyes: Conjunctivae and EOM are normal. Pupils are equal, round, and reactive to light.  Neck: No tracheal deviation present. No thyroid mass and no thyromegaly present.  Cardiovascular: Normal rate and regular rhythm.   No murmur heard. Pulses:      Dorsalis pedis pulses are 2+ on the right side, and  2+ on the left side.  Respiratory: Effort normal and breath sounds normal. No respiratory distress.  GI: Soft. She exhibits no mass. There is no hepatomegaly. There is no tenderness.  Musculoskeletal: She exhibits no edema.  Neurological: She is alert and oriented to person, place, and time. She has normal strength. No cranial nerve deficit. Coordination and gait normal.  Skin: Skin is warm. Rash noted. Rash is macular. There is erythema.  Macular, scaly, erythematous on palms and fingers, right>left, no tender. No local heat or drainage  Psychiatric: She has a normal mood and affect. Cognition and memory are normal.  Well groomed, good eye contact.      ASSESSMENT AND PLAN:     Lysbeth was seen today for establish care.  Diagnoses and all orders for this visit:   Gastroesophageal reflux disease, esophagitis presence not specified  Well controlled. Some side effects of PPI medications discussed. Also adverse effects  of GERD given her Hx of esophageal strictures in the past. GERD precautions discussed. Follow-up in 6-12 months.  Periodic headache syndrome, not intractable  Stable overall. I recommended considering prophylactic treatment with Topamax or amitriptyline (some headaches she describes could be tension like headache) but she is not interested in taking "one more pill" Follow-up in 6-12 months.  -     zolmitriptan (ZOMIG) 5 MG tablet; Take 1 tablet (5 mg total) by mouth as needed for migraine.  Essential hypertension  Adequately controlled. No changes in current management. DASH-low salt diet recommended. Eye exam recommended annually. F/U in 6 months, before if needed.  -     Basic metabolic panel -     atenolol (TENORMIN) 100 MG tablet; Take 1 tablet (100 mg total) by mouth daily. -     lisinopril-hydrochlorothiazide (PRINZIDE,ZESTORETIC) 10-12.5 MG tablet; Take 1 tablet by mouth daily.    Dyshidrotic eczema  Some side effects of topical steroid  discussed. Recommend using small amount on affected area mainly at night and for up to 14 days.  -     clobetasol cream (TEMOVATE) 0.05 %; Apply 1 application topically 2 (two) times daily. 14 days at the time.    Need for immunization against influenza -     Flu Vaccine QUAD 36+ mos IM        Jacqueline Delapena G. [place], MD  [facility] [place]. [place] office.

## 2016-05-03 NOTE — Patient Instructions (Signed)
A few things to remember from today's visit:   Need for immunization against influenza - Plan: Flu Vaccine QUAD 36+ mos IM  Periodic headache syndrome, not intractable  Essential hypertension - Plan: Basic metabolic panel  Gastroesophageal reflux disease, esophagitis presence not specified  Dyshidrotic eczema    Avoid foods that make your symptoms worse, for example coffee, chocolate,pepermeint,alcohol, and greasy food. Raising the head of your bed about 6 inches may help with nocturnal symptoms.   Weight loss (if you are overweight). Avoid lying down for 3 hours after eating.  Instead 3 large meals daily try small and more frequent meals during the day.    You should be evaluated immediately if bloody vomiting, bloody stools, black stools (like tar), difficulty swallowing, food gets stuck on the way down or choking when eating. Abnormal weight loss or severe abdominal pain.  Blood pressure goal for most people is less than 140/90.  Elevated blood pressure increases the risk of strokes, heart and kidney disease, and eye problems. Regular physical activity and a healthy diet (DASH diet) usually help. Low salt diet. Take medications as instructed. Caution with some over the counter medications as cold medications, dietary products (for weight loss), and Ibuprofen or Aleve (frequent use);all these medications could cause elevation of blood pressure.    Please be sure medication list is accurate. If a new problem present, please set up appointment sooner than planned today.

## 2016-05-04 ENCOUNTER — Encounter: Payer: Self-pay | Admitting: Family Medicine

## 2016-05-09 ENCOUNTER — Other Ambulatory Visit: Payer: Self-pay

## 2016-05-09 MED ORDER — METOPROLOL SUCCINATE ER 100 MG PO TB24: 100.0000 mg | ORAL_TABLET | Freq: Every day | ORAL | 2 refills | Status: DC

## 2016-06-11 ENCOUNTER — Other Ambulatory Visit: Payer: Self-pay | Admitting: Family Medicine

## 2016-06-11 DIAGNOSIS — L301 Dyshidrosis [pompholyx]: Secondary | ICD-10-CM

## 2016-06-11 NOTE — Telephone Encounter (Signed)
Okay to refill? 

## 2016-06-12 ENCOUNTER — Other Ambulatory Visit: Payer: Self-pay | Admitting: Obstetrics and Gynecology

## 2016-09-03 ENCOUNTER — Other Ambulatory Visit: Payer: Self-pay | Admitting: Obstetrics and Gynecology

## 2016-09-03 NOTE — Telephone Encounter (Signed)
Medication refill request: Estradiol cream Last AEX:  04/15/16 JJ Next AEX: Not scheduled Last MMG (if hormonal medication request): 10/18/15 [email or web address], Density A, [facility] Refill authorized: 04/15/16 #42.5g 1R. Please advise. Thank you.

## 2016-12-28 ENCOUNTER — Other Ambulatory Visit: Payer: Self-pay | Admitting: Obstetrics and Gynecology

## 2016-12-30 NOTE — Telephone Encounter (Signed)
Refill sent. The patient is also due for a mammogram. Please inform

## 2016-12-30 NOTE — Telephone Encounter (Signed)
Medication refill request: Estrace  Last AEX:  04-15-16  Next AEX: not scheduled (left message to call and schedule AEX for 04/2017 and MMG)  Last MMG (if hormonal medication request): 10-18-15 WNL  Refill authorized: please advise

## 2016-12-30 NOTE — Telephone Encounter (Signed)
Medication refill request: Estradiol cream Last AEX:  04/15/16 JJ Next AEX: Not scheduled Last MMG (if hormonal medication request): 10/18/15 [email or web address], Density A, [facility] Refill authorized: 09/04/16 #42.5g 0R. Please advise. Thank you.

## 2017-01-08 ENCOUNTER — Other Ambulatory Visit: Payer: Self-pay | Admitting: Family Medicine

## 2017-01-08 DIAGNOSIS — I10 Essential (primary) hypertension: Secondary | ICD-10-CM

## 2017-01-11 ENCOUNTER — Other Ambulatory Visit: Payer: Self-pay | Admitting: Family Medicine

## 2017-01-11 DIAGNOSIS — I10 Essential (primary) hypertension: Secondary | ICD-10-CM

## 2017-09-01 ENCOUNTER — Encounter: Payer: Self-pay | Admitting: Obstetrics and Gynecology

## 2017-10-20 ENCOUNTER — Other Ambulatory Visit: Payer: Self-pay | Admitting: Obstetrics and Gynecology

## 2017-10-20 ENCOUNTER — Other Ambulatory Visit (HOSPITAL_COMMUNITY)
Admission: RE | Admit: 2017-10-20 | Discharge: 2017-10-20 | Disposition: A | Discharge status: Home / Self Care | Payer: 59 | Source: Ambulatory Visit | Attending: Obstetrics and Gynecology | Admitting: Obstetrics and Gynecology

## 2017-10-20 ENCOUNTER — Telehealth: Payer: Self-pay | Admitting: Obstetrics and Gynecology

## 2017-10-20 ENCOUNTER — Encounter: Payer: Self-pay | Admitting: Obstetrics and Gynecology

## 2017-10-20 ENCOUNTER — Other Ambulatory Visit: Payer: Self-pay

## 2017-10-20 ENCOUNTER — Ambulatory Visit: Payer: 59 | Admitting: Obstetrics and Gynecology

## 2017-10-20 VITALS — BP 110/70 | HR 64 | Resp 14 | Ht 62.0 in | Wt 183.0 lb

## 2017-10-20 DIAGNOSIS — R6882 Decreased libido: Secondary | ICD-10-CM | POA: Diagnosis not present

## 2017-10-20 DIAGNOSIS — N3281 Overactive bladder: Secondary | ICD-10-CM | POA: Diagnosis not present

## 2017-10-20 DIAGNOSIS — N852 Hypertrophy of uterus: Secondary | ICD-10-CM | POA: Insufficient documentation

## 2017-10-20 DIAGNOSIS — Z01419 Encounter for gynecological examination (general) (routine) without abnormal findings: Secondary | ICD-10-CM | POA: Diagnosis not present

## 2017-10-20 DIAGNOSIS — N952 Postmenopausal atrophic vaginitis: Secondary | ICD-10-CM | POA: Insufficient documentation

## 2017-10-20 DIAGNOSIS — Z124 Encounter for screening for malignant neoplasm of cervix: Secondary | ICD-10-CM

## 2017-10-20 DIAGNOSIS — N941 Unspecified dyspareunia: Secondary | ICD-10-CM | POA: Insufficient documentation

## 2017-10-20 DIAGNOSIS — Z79899 Other long term (current) drug therapy: Secondary | ICD-10-CM | POA: Insufficient documentation

## 2017-10-20 DIAGNOSIS — Z8052 Family history of malignant neoplasm of bladder: Secondary | ICD-10-CM | POA: Diagnosis not present

## 2017-10-20 DIAGNOSIS — I10 Essential (primary) hypertension: Secondary | ICD-10-CM | POA: Insufficient documentation

## 2017-10-20 DIAGNOSIS — Z8249 Family history of ischemic heart disease and other diseases of the circulatory system: Secondary | ICD-10-CM | POA: Insufficient documentation

## 2017-10-20 DIAGNOSIS — Z1151 Encounter for screening for human papillomavirus (HPV): Secondary | ICD-10-CM | POA: Diagnosis not present

## 2017-10-20 DIAGNOSIS — K219 Gastro-esophageal reflux disease without esophagitis: Secondary | ICD-10-CM | POA: Insufficient documentation

## 2017-10-20 MED ORDER — ESTRADIOL 0.1 MG/GM VA CREA: TOPICAL_CREAM | VAGINAL | 1 refills | Status: DC

## 2017-10-20 MED ORDER — OXYBUTYNIN CHLORIDE ER 5 MG PO TB24: 5.0000 mg | ORAL_TABLET | Freq: Every day | ORAL | 1 refills | Status: DC

## 2017-10-20 NOTE — Patient Instructions (Addendum)
EXERCISE AND DIET:  We recommended that you start or continue a regular exercise program for good health. Regular exercise means any activity that makes your heart beat faster and makes you sweat.  We recommend exercising at least 30 minutes per day at least 3 days a week, preferably 4 or 5.  We also recommend a diet low in fat and sugar.  Inactivity, poor dietary choices and obesity can cause diabetes, heart attack, stroke, and kidney damage, among others.    ALCOHOL AND SMOKING:  Women should limit their alcohol intake to no more than 7 drinks/beers/glasses of wine (combined, not each!) per week. Moderation of alcohol intake to this level decreases your risk of breast cancer and liver damage. And of course, no recreational drugs are part of a healthy lifestyle.  And absolutely no smoking or even second hand smoke. Most people know smoking can cause heart and lung diseases, but did you know it also contributes to weakening of your bones? Aging of your skin?  Yellowing of your teeth and nails?  CALCIUM AND VITAMIN D:  Adequate intake of calcium and Vitamin D are recommended.  The recommendations for exact amounts of these supplements seem to change often, but generally speaking 600 mg of calcium (either carbonate or citrate) and 800 units of Vitamin D per day seems prudent. Certain women may benefit from higher intake of Vitamin D.  If you are among these women, your doctor will have told you during your visit.    PAP SMEARS:  Pap smears, to check for cervical cancer or precancers,  have traditionally been done yearly, although recent scientific advances have shown that most women can have pap smears less often.  However, every woman still should have a physical exam from her gynecologist every year. It will include a breast check, inspection of the vulva and vagina to check for abnormal growths or skin changes, a visual exam of the cervix, and then an exam to evaluate the size and shape of the uterus and  ovaries.  And after 56 years of age, a rectal exam is indicated to check for rectal cancers. We will also provide age appropriate advice regarding health maintenance, like when you should have certain vaccines, screening for sexually transmitted diseases, bone density testing, colonoscopy, mammograms, etc.   MAMMOGRAMS:  All women over 40 years old should have a yearly mammogram. Many facilities now offer a "3D" mammogram, which may cost around $50 extra out of pocket. If possible,  we recommend you accept the option to have the 3D mammogram performed.  It both reduces the number of women who will be called back for extra views which then turn out to be normal, and it is better than the routine mammogram at detecting truly abnormal areas.    COLONOSCOPY:  Colonoscopy to screen for colon cancer is recommended for all women at age 50.  We know, you hate the idea of the prep.  We agree, BUT, having colon cancer and not knowing it is worse!!  Colon cancer so often starts as a polyp that can be seen and removed at colonscopy, which can quite literally save your life!  And if your first colonoscopy is normal and you have no family history of colon cancer, most women don't have to have it again for 10 years.  Once every ten years, you can do something that may end up saving your life, right?  We will be happy to help you get it scheduled when you are ready.    Be sure to check your insurance coverage so you understand how much it will cost.  It may be covered as a preventative service at no cost, but you should check your particular policy.      Overactive Bladder, Adult Overactive bladder is a group of urinary symptoms. With overactive bladder, you may suddenly feel the need to pass urine (urinate) right away. After feeling this sudden urge, you might also leak urine if you cannot get to the bathroom fast enough (urinary incontinence). These symptoms might interfere with your daily work or social activities.  Overactive bladder symptoms may also wake you up at night. Overactive bladder affects the nerve signals between your bladder and your brain. Your bladder may get the signal to empty before it is full. Very sensitive muscles can also make your bladder squeeze too soon. What are the causes? Many things can cause an overactive bladder. Possible causes include:  Urinary tract infection.  Infection of nearby tissues, such as the prostate.  Prostate enlargement.  Being pregnant with twins or more (multiples).  Surgery on the uterus or urethra.  Bladder stones, inflammation, or tumors.  Drinking too much caffeine or alcohol.  Certain medicines, especially those that you take to help your body get rid of extra fluid (diuretics) by increasing urine production.  Muscle or nerve weakness, especially from: ? A spinal cord injury. ? Stroke. ? Multiple sclerosis. ? Parkinson disease.  Diabetes. This can cause a high urine volume that fills the bladder so quickly that the normal urge to urinate is triggered very strongly.  Constipation. A buildup of too much stool can put pressure on your bladder.  What increases the risk? You may be at greater risk for overactive bladder if you:  Are an older adult.  Smoke.  Are going through menopause.  Have prostate problems.  Have a neurological disease, such as stroke, dementia, Parkinson disease, or multiple sclerosis (MS).  Eat or drink things that irritate the bladder. These include alcohol, spicy food, and caffeine.  Are overweight or obese.  What are the signs or symptoms? The signs and symptoms of an overactive bladder include:  Sudden, strong urges to urinate.  Leaking urine.  Urinating eight or more times per day.  Waking up to urinate two or more times per night.  How is this diagnosed? Your health care provider may suspect overactive bladder based on your symptoms. The health care provider will do a physical exam and take  your medical history. Blood or urine tests may also be done. For example, you might need to have a bladder function test to check how well you can hold your urine. You might also need to see a health care provider who specializes in the urinary tract (urologist). How is this treated? Treatment for overactive bladder depends on the cause of your condition and whether it is mild or severe. Certain treatments can be done in your health care provider's office or clinic. You can also make lifestyle changes at home. Options include: Behavioral Treatments  Biofeedback. A specialist uses sensors to help you become aware of your body's signals.  Keeping a daily log of when you need to urinate and what happens after the urge. This may help you manage your condition.  Bladder training. This helps you learn to control the urge to urinate by following a schedule that directs you to urinate at regular intervals (timed voiding). At first, you might have to wait a few minutes after feeling the urge. In time,   you should be able to schedule bathroom visits an hour or more apart.  Kegel exercises. These are exercises to strengthen the pelvic floor muscles, which support the bladder. Toning these muscles can help you control urination, even if your bladder muscles are overactive. A specialist will teach you how to do these exercises correctly. They require daily practice.  Weight loss. If you are obese or overweight, losing weight might relieve your symptoms of overactive bladder. Talk to your health care provider about losing weight and whether there is a specific program or method that would work best for you. Diet change. This might help if constipation is making your overactive bladder worse. Your health care provider or a dietitian can explain ways to change what you eat to ease constipation. You might also need to consume less alcohol and caffeine or drink other fluids at different times  Breast  Self-Awareness Breast self-awareness means being familiar with how your breasts look and feel. It involves checking your breasts regularly and reporting any changes to your health care provider. Practicing breast self-awareness is important. A change in your breasts can be a sign of a serious medical problem. Being familiar with how your breasts look and feel allows you to find any problems early, when treatment is more likely to be successful. All women should practice breast self-awareness, including women who have had breast implants. How to do a breast self-exam One way to learn what is normal for your breasts and whether your breasts are changing is to do a breast self-exam. To do a breast self-exam: Look for Changes  Remove all the clothing above your waist. Stand in front of a mirror in a room with good lighting. Put your hands on your hips. Push your hands firmly downward. Compare your breasts in the mirror. Look for differences between them (asymmetry), such as: Differences in shape. Differences in size. Puckers, dips, and bumps in one breast and not the other. Look at each breast for changes in your skin, such as: Redness. Scaly areas. Look for changes in your nipples, such as: Discharge. Bleeding. Dimpling. Redness. A change in position. Feel for Changes  Carefully feel your breasts for lumps and changes. It is best to do this while lying on your back on the floor and again while sitting or standing in the shower or tub with soapy water on your skin. Feel each breast in the following way: Place the arm on the side of the breast you are examining above your head. Feel your breast with the other hand. Start in the nipple area and make  inch (2 cm) overlapping circles to feel your breast. Use the pads of your three middle fingers to do this. Apply light pressure, then medium pressure, then firm pressure. The light pressure will allow you to feel the tissue closest to the skin.  The medium pressure will allow you to feel the tissue that is a little deeper. The firm pressure will allow you to feel the tissue close to the ribs. Continue the overlapping circles, moving downward over the breast until you feel your ribs below your breast. Move one finger-width toward the center of the body. Continue to use the  inch (2 cm) overlapping circles to feel your breast as you move slowly up toward your collarbone. Continue the up and down exam using all three pressures until you reach your armpit.  Write Down What You Find  Write down what is normal for each breast and any changes that you  find. Keep a written record with breast changes or normal findings for each breast. By writing this information down, you do not need to depend only on memory for size, tenderness, or location. Write down where you are in your menstrual cycle, if you are still menstruating. If you are having trouble noticing differences in your breasts, do not get discouraged. With time you will become more familiar with the variations in your breasts and more comfortable with the exam. How often should I examine my breasts? Examine your breasts every month. If you are breastfeeding, the best time to examine your breasts is after a feeding or after using a breast pump. If you menstruate, the best time to examine your breasts is 5-7 days after your period is over. During your period, your breasts are lumpier, and it may be more difficult to notice changes. When should I see my health care provider? See your health care provider if you notice: A change in shape or size of your breasts or nipples. A change in the skin of your breast or nipples, such as a reddened or scaly area. Unusual discharge from your nipples. A lump or thick area that was not there before. Pain in your breasts. Anything that concerns you.  This information is not intended to replace advice given to you by your health care provider. Make sure  you discuss any questions you have with your health care provider. Document Released: 07/01/2005 Document Revised: 12/07/2015 Document Reviewed: 05/21/2015 Elsevier Interactive Patient Education  [employer].  of the day.  Stopping smoking.  Wearing pads to absorb leakage while you wait for other treatments to take effect. Physical Treatments  Electrical stimulation. Electrodes send gentle pulses of electricity to strengthen the nerves or muscles that help to control the bladder. Sometimes, the electrodes are placed outside of the body. In other cases, they might be placed inside the body (implanted). This treatment can take several months to have an effect.  Supportive devices. Women may need a plastic device that fits into the vagina and supports the bladder (pessary). Medicines Several medicines can help treat overactive bladder and are usually used along with other treatments. Some are injected into the muscles involved in urination. Others come in pill form. Your health care provider may prescribe:  Antispasmodics. These medicines block the signals that the nerves send to the bladder. This keeps the bladder from releasing urine at the wrong time.  Tricyclic antidepressants. These types of antidepressants also relax bladder muscles.  Surgery  You may have a device implanted to help manage the nerve signals that indicate when you need to urinate.  You may have surgery to implant electrodes for electrical stimulation.  Sometimes, very severe cases of overactive bladder require surgery to change the shape of the bladder. Follow these instructions at home:  Take medicines only as directed by your health care provider.  Use any implants or a pessary as directed by your health care provider.  Make any diet or lifestyle changes that are recommended by your health care provider. These might include: ? Drinking less fluid or drinking at different times of the day. If you need to  urinate often during the night, you may need to stop drinking fluids early in the evening. ? Cutting down on caffeine or alcohol. Both can make an overactive bladder worse. Caffeine is found in coffee, tea, and sodas. ? Doing Kegel exercises to strengthen muscles. ? Losing weight if you need to. ? Eating a healthy and  balanced diet to prevent constipation.  Keep a journal or log to track how much and when you drink and also when you feel the need to urinate. This will help your health care provider to monitor your condition. Contact a health care provider if:  Your symptoms do not get better after treatment.  Your pain and discomfort are getting worse.  You have more frequent urges to urinate.  You have a fever. Get help right away if: You are not able to control your bladder at all. This information is not intended to replace advice given to you by your health care provider. Make sure you discuss any questions you have with your health care provider. Document Released: 04/27/2009 Document Revised: 12/07/2015 Document Reviewed: 11/24/2013 Elsevier Interactive Patient Education  [employer].

## 2017-10-20 NOTE — Telephone Encounter (Signed)
Patient's Rx for estrace cream was originally sent to [facility] but she cancelled that one and would like it to go to [facility] in [place]. Pharmacy number is [phone].

## 2017-10-20 NOTE — Telephone Encounter (Signed)
Call placed to patient to review benefits for a recommended ultrasound. Left voicemail message requesting a return call. °

## 2017-10-20 NOTE — Progress Notes (Signed)
57 y.o. [id number] MarriedCaucasianF here for annual exam. Postmenopausal, no bleeding. No significant vasomotor symptoms.   She uses vaginal estrogen cream, using it 2 x a week. She is sexually active, infrequent, some entry pain. Lubricant makes it tolerable. Declines dilators.     No LMP recorded. Patient has had an ablation.          Sexually active: Yes.    The current method of family planning is post menopausal status.    Exercising: No.  The patient does not participate in regular exercise at present. Smoker:  no  Health Maintenance: Pap:  10-20-13 WNL NEG HR HPV  History of abnormal Pap:  no MMG:  08-27-17 WNL Colonoscopy:  06-24-14 polyps  BMD:   Never TDaP:  03-15-15 Gardasil: N/A   reports that she has never smoked. She has never used smokeless tobacco. She reports that she drinks about 1.2 oz of alcohol per week. She reports that she does not use drugs.Homemaker. Son is working in [place]. Daughter is still in college.    Past Medical History:  Diagnosis Date  . Aortic regurgitation   . Eczema   . Eosinophilic esophagitis 30/16  . Esophageal stricture   . Exercise-induced asthma   . GERD (gastroesophageal reflux disease)   . History of uterine fibroid    [id number] cm   . Hypertension   . Migraine without aura 05/2005    Past Surgical History:  Procedure Laterality Date  . ABDOMINOPLASTY  2000  . ABLATION  01/06/2004   HTA  . BREAST REDUCTION SURGERY    . [place]  . MYOMECTOMY  1995  . TONSILLECTOMY    . uterine ablation      Current Outpatient Medications  Medication Sig Dispense Refill  . atenolol (TENORMIN) 100 MG tablet TAKE 1 TABLET BY MOUTH  DAILY 90 tablet 0  . calcium-vitamin D (OSCAL WITH D) 500-200 MG-UNIT per tablet Take 1 tablet by mouth daily.      . cetirizine (ZYRTEC) 10 MG tablet Take 10 mg by mouth as needed.     . clobetasol cream (TEMOVATE) 0.05 % Apply topically 2 (two) times daily as needed. 45 g 1  . ESTRACE VAGINAL 0.1  MG/GM vaginal cream USE 1 GRAM VAGINALLY 2 TIMES A WEEK AT BEDTIME 42.5 g 0  . lisinopril-hydrochlorothiazide (PRINZIDE,ZESTORETIC) 10-12.5 MG tablet TAKE 1 TABLET BY MOUTH  DAILY 90 tablet 1  . pantoprazole (PROTONIX) 40 MG tablet TAKE 1 TABLET DAILY 90 tablet 2  . zolmitriptan (ZOMIG) 5 MG tablet Take 1 tablet (5 mg total) by mouth as needed for migraine. 30 tablet 3   No current facility-administered medications for this visit.     Family History  Problem Relation Age of Onset  . Hypertension Mother   . Other Mother        mvp, bicuspid aortic valve cabg, valve replacement  . Migraines Mother        went away after menopause  . Bladder Cancer Father   . Hypertension Father   . Breast cancer Maternal Grandmother 65    Review of Systems  Constitutional: Negative.   HENT: Negative.   Eyes: Negative.   Respiratory: Negative.   Cardiovascular: Negative.   Gastrointestinal: Negative.   Endocrine: Negative.   Genitourinary:       Loss of sexual interest Painful intercourse Vaginal dryness  Night urination   Musculoskeletal: Negative.   Skin: Negative.   Allergic/Immunologic: Negative.   Neurological: Positive for headaches.  Psychiatric/Behavioral: Negative.   Nocturia 4-5 x a night. Small amount. Has been like this since she had kids. She is voiding 1 x an hour during the day. No leakage. She has had urine cultures, negative.   Exam:   BP 110/70 (BP Location: Right Arm, Patient Position: Sitting, Cuff Size: Normal)   Pulse 64   Resp 14   Ht 5\' 2"  (1.575 m)   Wt 183 lb (83 kg)   BMI 33.47 kg/m   Weight change: @WEIGHTCHANGE @ Height:   Height: 5\' 2"  (157.5 cm)  Ht Readings from Last 3 Encounters:  10/20/17 5\' 2"  (1.575 m)  05/03/16 5\' 2"  (1.575 m)  04/15/16 5\' 2"  (1.575 m)    General appearance: alert, cooperative and appears stated age Head: Normocephalic, without obvious abnormality, atraumatic Neck: no adenopathy, supple, symmetrical, trachea midline and thyroid  normal to inspection and palpation and right lobe slightly larger than the left, prior normal imaging Lungs: clear to auscultation bilaterally Cardiovascular: regular rate and rhythm Breasts: normal appearance, no masses or tenderness Abdomen: soft, non-tender; non distended,  no masses,  no organomegaly Extremities: extremities normal, atraumatic, no cyanosis or edema Skin: Skin color, texture, turgor normal. No rashes or lesions Lymph nodes: Cervical, supraclavicular, and axillary nodes normal. No abnormal inguinal nodes palpated Neurologic: Grossly normal   Pelvic: External genitalia:  no lesions              Urethra:  normal appearing urethra with no masses, tenderness or lesions              Bartholins and Skenes: normal                 Vagina: normal appearing, mildly atrophic vagina with normal color and discharge, no lesions              Cervix: no lesions               Bimanual Exam:  Uterus:  retroverted, mobile, slightly enlarged and irregular (patient reports prior h/o myomectomy)              Adnexa: no mass, fullness, tenderness               Rectovaginal: Confirms               Anus:  normal sphincter tone, no lesions  Chaperone was present for exam.  A:  Well Woman with normal exam  Overactive bladder  Low libido, info given   Dyspareunia, using vaginal estrogen, discussed option of dilators (declines)  Vaginal atrophy, helped with vaginal estrogen  Slightly enlarged and irregular uterus (h/o myomectomy)    P:   Labs with primary  Pap with hpv  Discussed breast self exam  Discussed calcium and vit D intake  Discussed option of PT or medication for her OAB, wants to try medication   Start Ditropan 5 mg XL, f/u in one month  Mammogram and colonoscopy UTD  Return for gyn ultrasound

## 2017-10-20 NOTE — Telephone Encounter (Signed)
Left detailed message that Estradiol vaginal cream has been sent to [facility] in [place] on file. Advised to return call with any further questions.  Routing to provider for final review. Patient agreeable to disposition. Will close encounter.

## 2017-10-21 NOTE — Telephone Encounter (Signed)
Second call placed to patient to review benefits and schedule recommended ultrasound. Left voicemail message requesting a return call °

## 2017-10-22 LAB — CYTOLOGY - PAP
Diagnosis: NEGATIVE
HPV: NOT DETECTED

## 2017-10-27 NOTE — Telephone Encounter (Signed)
Third call placed to patient to review benefits and schedule recommended ultrasound. Left voicemail message requesting a return call

## 2017-11-04 NOTE — Telephone Encounter (Signed)
Fourth call placed to patient to review benefits and schedule recommended ultrasound. Left voicemail message requesting a return call.  Forwarding to Dr Talbert Nan for review  cc: Lamont Snowball, RN

## 2017-11-11 NOTE — Telephone Encounter (Signed)
Please see previous messages. Voicemail message messages left for patient on 10/20/17, 10/21/17, 10/27/17 and 11/04/17,  requesting a return call to schedule recommended ultrasound. Patient has not returned calls. Forwarding to provider to advise how to proceed.  Routing to Dr Talbert Nan  cc: Lamont Snowball, RN

## 2017-11-14 NOTE — Telephone Encounter (Signed)
Call from patient regarding refill on oxybutynin. States noticeable decrease in urinary frequency. Only up once at night.would like to continue medication. Requests 90 days to mail order pharmacy.  Advised of recommendation for ultrasound due to enlarged uterus and history of fibroid. Patient states she has just been so busy. Agreeable to schedule for 11-25-17.  Routing to provider for final review. Patient agreeable to disposition. Will close encounter.

## 2017-11-14 NOTE — Telephone Encounter (Signed)
Patient called to request a 90 day prescription of Ditropan. States the trial prescription worked well. Transferred call to Lamont Snowball, RN.

## 2017-11-14 NOTE — Telephone Encounter (Signed)
Please send her a letter asking her to call to set up an ultrasound secondary to findings at the time of her pelvic exam. Then close the encounter.

## 2017-11-16 NOTE — Telephone Encounter (Signed)
I'm happy to refill the ditropan for her, I just want to make sure I have her on the correct dose. I originally sent in 30 with one refill. When I see her next week I'm happy to send in refills.  If she is sure this is the correct dose for herself and doesn't want to wait until next week to discuss it, we can send in the 90 day supply with refills. Please check with her

## 2017-11-17 NOTE — Telephone Encounter (Signed)
Left message to call Madasyn Heath at [phone].  

## 2017-11-18 MED ORDER — OXYBUTYNIN CHLORIDE ER 5 MG PO TB24: 5.0000 mg | ORAL_TABLET | Freq: Every day | ORAL | 3 refills | Status: DC

## 2017-11-18 NOTE — Telephone Encounter (Signed)
Spoke with patient. Advised as seen below per Dr. Talbert Nan. Patient states she is doing well with the ditropan 5mg  dose, does not wish to increase dosage. Patient request RX 90 days supply to [facility].   Ditropan 5mg  #90/3RF to [facility].   Routing to provider for final review. Patient is agreeable to disposition. Will close encounter.

## 2017-11-25 ENCOUNTER — Other Ambulatory Visit: Payer: 59 | Admitting: Obstetrics and Gynecology

## 2017-11-25 ENCOUNTER — Other Ambulatory Visit: Payer: 59

## 2017-12-02 ENCOUNTER — Encounter: Payer: Self-pay | Admitting: Obstetrics and Gynecology

## 2017-12-02 ENCOUNTER — Ambulatory Visit (INDEPENDENT_AMBULATORY_CARE_PROVIDER_SITE_OTHER): Payer: 59

## 2017-12-02 ENCOUNTER — Other Ambulatory Visit: Payer: Self-pay

## 2017-12-02 ENCOUNTER — Ambulatory Visit: Payer: 59 | Admitting: Obstetrics and Gynecology

## 2017-12-02 VITALS — BP 118/68 | HR 72 | Resp 16 | Wt 188.0 lb

## 2017-12-02 DIAGNOSIS — D251 Intramural leiomyoma of uterus: Secondary | ICD-10-CM

## 2017-12-02 DIAGNOSIS — D250 Submucous leiomyoma of uterus: Secondary | ICD-10-CM | POA: Diagnosis not present

## 2017-12-02 DIAGNOSIS — N852 Hypertrophy of uterus: Secondary | ICD-10-CM

## 2017-12-02 DIAGNOSIS — D252 Subserosal leiomyoma of uterus: Secondary | ICD-10-CM

## 2017-12-02 DIAGNOSIS — N3281 Overactive bladder: Secondary | ICD-10-CM

## 2017-12-02 MED ORDER — ESTRADIOL 0.1 MG/GM VA CREA: TOPICAL_CREAM | VAGINAL | 1 refills | Status: DC

## 2017-12-02 NOTE — Progress Notes (Signed)
GYNECOLOGY  VISIT   HPI: 57 y.o.   Married  Caucasian  female   G2P2002 with No LMP recorded. Patient has had an ablation.   here for follow up enlarged uterus and treatment of overactive bladder. At her annual exam last month, her uterus felt slightly enlarged and irregular, U/S was recommended. She was also started on Ditropan 5 mg XL for urinary frequency (1 x an hour) and nocturia 4-5 x a night. She denied leakage and had a negative urine culture.  She is feeling better with the ditropan, frequency is down to every 2 hours during the day. Nocturia is down to one x a night.  No side effects from the ditropan.   GYNECOLOGIC HISTORY: No LMP recorded. Patient has had an ablation. Contraception:postmenopause  Menopausal hormone therapy: none         OB History    Gravida  2   Para  2   Term  2   Preterm      AB      Living  2     SAB      TAB      Ectopic      Multiple      Live Births  2              Patient Active Problem List   Diagnosis Date Noted  . Eosinophilic esophagitis 62/22/9798  . History of colonic polyps 08/26/2014  . Encounter for screening colonoscopy 05/05/2014  . Esophageal stricture 05/05/2014  . Eczema   . Hypertension   . Routine gynecological examination 08/06/2011  . Abnormal urine 08/06/2011  . Visit for preventive health examination 08/06/2011  . GERD (gastroesophageal reflux disease) 06/21/2011  . Exercise-induced asthma   . OBESITY 05/14/2010  . Dyshidrotic eczema 05/14/2010  . Migraine headache 09/09/2007  . Essential hypertension 09/09/2007  . OVERACTIVE BLADDER 09/09/2007    Past Medical History:  Diagnosis Date  . Aortic regurgitation   . Eczema   . Eosinophilic esophagitis 92/11  . Esophageal stricture   . Exercise-induced asthma   . GERD (gastroesophageal reflux disease)   . History of uterine fibroid    ut10x7x6 cm   . Hypertension   . Migraine without aura 05/2005    Past Surgical History:    Procedure Laterality Date  . ABDOMINOPLASTY  2000  . ABLATION  01/06/2004   HTA  . BREAST REDUCTION SURGERY    . [place]  . MYOMECTOMY  1995  . TONSILLECTOMY    . uterine ablation      Current Outpatient Medications  Medication Sig Dispense Refill  . atenolol (TENORMIN) 100 MG tablet TAKE 1 TABLET BY MOUTH  DAILY 90 tablet 0  . calcium-vitamin D (OSCAL WITH D) 500-200 MG-UNIT per tablet Take 1 tablet by mouth daily.      . cetirizine (ZYRTEC) 10 MG tablet Take 10 mg by mouth as needed.     . clobetasol cream (TEMOVATE) 0.05 % Apply topically 2 (two) times daily as needed. 45 g 1  . estradiol (ESTRACE VAGINAL) 0.1 MG/GM vaginal cream USE 1 GRAM VAGINALLY 2 TIMES A WEEK AT BEDTIME 42.5 g 1  . lisinopril-hydrochlorothiazide (PRINZIDE,ZESTORETIC) 10-12.5 MG tablet TAKE 1 TABLET BY MOUTH  DAILY 90 tablet 1  . oxybutynin (DITROPAN-XL) 5 MG 24 hr tablet Take 1 tablet (5 mg total) by mouth at bedtime. 90 tablet 3  . pantoprazole (PROTONIX) 40 MG tablet TAKE 1 TABLET DAILY 90 tablet  2  . zolmitriptan (ZOMIG) 5 MG tablet Take 1 tablet (5 mg total) by mouth as needed for migraine. 30 tablet 3   No current facility-administered medications for this visit.      ALLERGIES: Sulfonamide derivatives  Family History  Problem Relation Age of Onset  . Hypertension Mother   . Other Mother        mvp, bicuspid aortic valve cabg, valve replacement  . Migraines Mother        went away after menopause  . Bladder Cancer Father   . Hypertension Father   . Breast cancer Maternal Grandmother 31    Social History   Socioeconomic History  . Marital status: Married    Spouse name: Not on file  . Number of children: Not on file  . Years of education: Not on file  . Highest education level: Not on file  Occupational History  . Not on file  Social Needs  . Financial resource strain: Not on file  . Food insecurity:    Worry: Not on file    Inability: Not on file  .  Transportation needs:    Medical: Not on file    Non-medical: Not on file  Tobacco Use  . Smoking status: Never Smoker  . Smokeless tobacco: Never Used  Substance and Sexual Activity  . Alcohol use: Yes    Alcohol/week: 1.2 oz    Types: 2 Standard drinks or equivalent per week  . Drug use: No  . Sexual activity: Yes    Partners: Male    Birth control/protection: Post-menopausal  Lifestyle  . Physical activity:    Days per week: Not on file    Minutes per session: Not on file  . Stress: Not on file  Relationships  . Social connections:    Talks on phone: Not on file    Gets together: Not on file    Attends religious service: Not on file    Active member of club or organization: Not on file    Attends meetings of clubs or organizations: Not on file    Relationship status: Not on file  . Intimate partner violence:    Fear of current or ex partner: Not on file    Emotionally abused: Not on file    Physically abused: Not on file    Forced sexual activity: Not on file  Other Topics Concern  . Not on file  Social History Narrative   Married   Regular exercise- no   2 caffeine   HH of 4   No pet   BS degree   Work in Futures trader    Review of Systems  Constitutional: Negative.   HENT: Negative.   Eyes: Negative.   Respiratory: Negative.   Cardiovascular: Negative.   Gastrointestinal: Negative.   Genitourinary: Negative.   Musculoskeletal: Negative.   Skin: Negative.   Neurological: Negative.   Endo/Heme/Allergies: Negative.   Psychiatric/Behavioral: Negative.     PHYSICAL EXAMINATION:    BP 118/68 (BP Location: Right Arm, Patient Position: Sitting, Cuff Size: Normal)   Pulse 72   Resp 16   Wt 188 lb (85.3 kg)   BMI 34.39 kg/m     General appearance: alert, cooperative and appears stated age  Reviewed ultrasound images with the patient.   ASSESSMENT Enlarged uterus on exam, multiple small fibroids on ultrasound, no concerning  findings on ultrasound OAB, much improved on Ditropan 5 mg XL    PLAN Discussed myomas, explains her exam. Not  symptomatic, no bleeding Continue the ditropan at the current dose. She will let me know if she wants to try a higher dose.  F/U at annual exam next year, sooner with concerns.   An After Visit Summary was printed and given to the patient.

## 2017-12-03 ENCOUNTER — Encounter: Payer: Self-pay | Admitting: Obstetrics and Gynecology

## 2018-11-30 ENCOUNTER — Other Ambulatory Visit: Payer: Self-pay | Admitting: Obstetrics and Gynecology

## 2018-11-30 NOTE — Telephone Encounter (Signed)
Medication refill request: oxybutynin 5mg  Last AEX:  10-20-17 Next AEX: not scheduled. Last MMG (if hormonal medication request): n/a Refill authorized: Note placed in pharmacy comment for patient to call to schedule aex for further refills. Please approve if appriate.

## 2019-01-12 ENCOUNTER — Encounter: Payer: Self-pay | Admitting: Obstetrics and Gynecology

## 2019-05-28 ENCOUNTER — Encounter: Payer: Self-pay | Admitting: Gastroenterology

## 2019-08-12 ENCOUNTER — Other Ambulatory Visit: Payer: Self-pay

## 2019-08-12 ENCOUNTER — Ambulatory Visit: Payer: 59 | Admitting: Cardiology

## 2019-08-12 ENCOUNTER — Encounter: Payer: Self-pay | Admitting: Cardiology

## 2019-08-12 VITALS — BP 138/82 | HR 60 | Temp 97.2°F | Ht 63.0 in | Wt 198.0 lb

## 2019-08-12 DIAGNOSIS — Z7182 Exercise counseling: Secondary | ICD-10-CM

## 2019-08-12 DIAGNOSIS — Z8249 Family history of ischemic heart disease and other diseases of the circulatory system: Secondary | ICD-10-CM

## 2019-08-12 DIAGNOSIS — Z7189 Other specified counseling: Secondary | ICD-10-CM | POA: Diagnosis not present

## 2019-08-12 DIAGNOSIS — R072 Precordial pain: Secondary | ICD-10-CM | POA: Diagnosis not present

## 2019-08-12 DIAGNOSIS — I10 Essential (primary) hypertension: Secondary | ICD-10-CM | POA: Diagnosis not present

## 2019-08-12 DIAGNOSIS — Z131 Encounter for screening for diabetes mellitus: Secondary | ICD-10-CM

## 2019-08-12 DIAGNOSIS — Z713 Dietary counseling and surveillance: Secondary | ICD-10-CM

## 2019-08-12 MED ORDER — ATENOLOL 25 MG PO TABS: 25.0000 mg | ORAL_TABLET | Freq: Every day | ORAL | 0 refills | Status: DC

## 2019-08-12 NOTE — Progress Notes (Signed)
Cardiology Office Note:    Date:  08/12/2019   ID:  [name], DOB [birth year], MRN [id number]  PCP:  Chesley Noon, MD  Cardiologist:  Buford Dresser, MD  Referring MD: Chesley Noon, MD   CC: new patient consultation for chest pain  History of Present Illness:    Angelica Wells is a 59 y.o. female with a hx of  who is seen as a new consult at the request of Chesley Noon, MD for the evaluation and management of chest pain and palpitations.  Referral from Dr. Melford Aase at [facility]. Last note that I can see in Care Everywhere 03/20/2017  She knows that she has a leaky heart valve, found on echo during a clinical trial for migraines. Saw a cardiologist, told it was fine.  For the last year, she has been having "spells" that can last 5-20 minutes. Starts as a squeeze on the right side, sometimes radiates to right shoulder and right jaw. Spells happen randomly, didn't happen for a month but then happened yesterday. Initially thought it was black tea and/or seltzer water, but this has now happened without drinking these. No other clear aggravating or alleviating factors. Not positional, not exertional. Often sitting at desk at work.   Also notices that she is short of breath with climbing her 16 stairs at home. She does endorse limited physical activity.  Has been on blood pressure medications since his early 83s. Her son (age 61) is just about to start blood pressure medications. Both were otherwise healthy, good lifestyle at the time. Doesn't check BP at home. Denies history of high cholesterol, not checked in several years. Never smoker, rare occasional alcohol.  Has occasional palpitations, feels like fluttering, but it is not problematic for her. No LE edema, PND or orthopnea. No syncope.  Mom had valve replacement and triple bypass in her late 75s. No other family history that she is aware of. Dad is 65 and generally healthy, had carotid procedure within the last ten  years.  Past Medical History:  Diagnosis Date   Aortic regurgitation    Eczema    Eosinophilic esophagitis [id number]   Esophageal stricture    Exercise-induced asthma    GERD (gastroesophageal reflux disease)    History of uterine fibroid    ut10x7x6 cm    Hypertension    Migraine without aura 05/2005    Past Surgical History:  Procedure Laterality Date   ABDOMINOPLASTY  2000   ABLATION  01/06/2004   HTA   BREAST REDUCTION SURGERY     CESAREAN SECTION  1996, [place]     uterine ablation      Current Medications: Current Outpatient Medications on File Prior to Visit  Medication Sig   calcium-vitamin D (OSCAL WITH D) 500-200 MG-UNIT per tablet Take 1 tablet by mouth daily.     cetirizine (ZYRTEC) 10 MG tablet Take 10 mg by mouth as needed.    clobetasol cream (TEMOVATE) 0.05 % Apply topically 2 (two) times daily as needed.   estradiol (ESTRACE VAGINAL) 0.1 MG/GM vaginal cream USE 1 GRAM VAGINALLY 2 TIMES A WEEK AT BEDTIME   lisinopril-hydrochlorothiazide (PRINZIDE,ZESTORETIC) 10-12.5 MG tablet TAKE 1 TABLET BY MOUTH  DAILY   oxybutynin (DITROPAN-XL) 5 MG 24 hr tablet TAKE 1 TABLET BY MOUTH AT  BEDTIME   pantoprazole (PROTONIX) 40 MG tablet TAKE 1 TABLET DAILY   zolmitriptan (ZOMIG) 5 MG tablet Take 1 tablet (5 mg total)  by mouth as needed for migraine.   No current facility-administered medications on file prior to visit.     Allergies:   Sulfonamide derivatives   Social History   Tobacco Use   Smoking status: Never Smoker   Smokeless tobacco: Never Used  Substance Use Topics   Alcohol use: Yes    Alcohol/week: 2.0 standard drinks    Types: 2 Standard drinks or equivalent per week   Drug use: No    Family History: family history includes Bladder Cancer in her father; Breast cancer (age of onset: 71) in her maternal grandmother; Hypertension in her father and mother; Migraines in her mother; Other in her  mother.  ROS:   Please see the history of present illness.  Additional pertinent ROS: Constitutional: Negative for chills, fever, night sweats, unintentional weight loss  HENT: Negative for ear pain and hearing loss.   Eyes: Negative for loss of vision and eye pain.  Respiratory: Negative for cough, sputum, wheezing.   Cardiovascular: See HPI. Gastrointestinal: Negative for abdominal pain, melena, and hematochezia.  Genitourinary: Negative for dysuria and hematuria.  Musculoskeletal: Negative for falls and myalgias.  Skin: Negative for itching and rash.  Neurological: Negative for focal weakness, focal sensory changes and loss of consciousness.  Endo/Heme/Allergies: Does not bruise/bleed easily.     EKGs/Labs/Other Studies Reviewed:    The following studies were reviewed today: No prior available  EKG:  EKG is personally reviewed.  The ekg ordered today demonstrates NSR, no R wave V1-V2.  Recent Labs: No results found for requested labs within last 8760 hours.  Recent Lipid Panel    Component Value Date/Time   CHOL 196 07/26/2011 0956   TRIG 82.0 07/26/2011 0956   HDL 51.90 07/26/2011 0956   CHOLHDL 4 07/26/2011 0956   VLDL 16.4 07/26/2011 0956   LDLCALC 128 (H) 07/26/2011 0956    Physical Exam:    VS:  BP 138/82    Pulse 60    Temp (!) 97.2 F (36.2 C)    Ht 5\' 3"  (1.6 m)    Wt 198 lb (89.8 kg)    SpO2 97%    BMI 35.07 kg/m     Wt Readings from Last 3 Encounters:  08/12/19 198 lb (89.8 kg)  12/02/17 188 lb (85.3 kg)  10/20/17 183 lb (83 kg)    GEN: Well nourished, well developed in no acute distress HEENT: Normal, moist mucous membranes NECK: No JVD CARDIAC: regular rhythm, normal S1 and S2, no rubs or gallops. No murmurs. VASCULAR: Radial and DP pulses 2+ bilaterally. No carotid bruits RESPIRATORY:  Clear to auscultation without rales, wheezing or rhonchi  ABDOMEN: Soft, non-tender, non-distended MUSCULOSKELETAL:  Ambulates independently SKIN: Warm and dry,  no edema NEUROLOGIC:  Alert and oriented x 3. No focal neuro deficits noted. PSYCHIATRIC:  Normal affect    ASSESSMENT:    1. Precordial pain   2. Essential hypertension   3. Family history of heart disease   4. Cardiac risk counseling   5. Counseling on health promotion and disease prevention   6. Nutritional counseling   7. Exercise counseling   8. Encounter for screening for diabetes mellitus    PLAN:    Precordial pain: right sided, but pressure that radiates to neck and shoulder. -We spent significant time today reviewing different parts of the cardiovascular system (electrical, vascular, functional, and valvular). We discussed how each of these systems can present with different symptoms. We reviewed that there are different ways we evaluate these  symptoms with tests. We reviewed which tests I think are most appropriate given the symptoms, and we discussed risks/benefits and limitations of each of these tests. Please see summary below. We also discussed that if testing is unrevealing for a cardiac cause of the symptoms, there are many noncardiac causes as well that can contribute to symptoms. If the heart is ruled out, then I recommend returning to PCP to discuss alternative diagnoses. -discussed treadmill stress, nuclear stress/lexiscan, and CT coronary angiography. Discussed pros and cons of each, including but not limited to false positive/false negative risk, radiation risk, and risk of IV contrast dye. Based on shared decision making, decision was made to pursue treadmill once BP optimized and CT as confirmatory test if treadmill abnormal.  Hypertension: longstanding, since her 18s. Has been on atenolol since that time. Has fatigue, exertional dyspnea. Interested in tapering off beta blocker to see if she feels better. -given extensive taper, below -close monitoring and follow up of BP -continue lisinopril-HCTZ -check BMET today, may be able to increase lisinopril-HCTZ if  needed  Cough -has longstanding dry cough, she attributes to GERD, but consider trialing off lisinopril in the future/changing to ARB  Cardiac risk counseling and prevention recommendations: -recommend heart healthy/Mediterranean diet, with whole grains, fruits, vegetable, fish, lean meats, nuts, and olive oil. Limit salt. -recommend moderate walking, 3-5 times/week for 30-50 minutes each session. Aim for at least 150 minutes.week. Goal should be pace of 3 miles/hours, or walking 1.5 miles in 30 minutes -recommend avoidance of tobacco products. Avoid excess alcohol. -Additional risk factor control:  -Diabetes risk: A1c is not available, she is not sure this was ever done. Has significant risk factors for metabolic syndrome/diabetes. Screen today  -Lipids: none available, has significant CV risk factors, screen today  -Blood pressure control: as above  -Weight: BMI 35, would benefit from weight loss.  Plan for follow up: 1 month for BP follow up. Order treadmill at that time.  Total time of encounter: 62 minutes total time of encounter, including 40 minutes spent in face-to-face patient care. This time includes coordination of care and counseling regarding symptoms, hypertension, CV risk. Remainder of non-face-to-face time involved reviewing chart documents/testing relevant to the patient encounter and documentation in the medical record. Time spent in [place] reviewing history/testing.  In at 2:55 PM, out at 3:35 PM.  Buford Dresser, MD, PhD [place]   [facility]    Medication Adjustments/Labs and Tests Ordered: Current medicines are reviewed at length with the patient today.  Concerns regarding medicines are outlined above.  Orders Placed This Encounter  Procedures   Basic metabolic panel   Hemoglobin A1c   Lipid panel   Meds ordered this encounter  Medications   atenolol (TENORMIN) 25 MG tablet    Sig: Take 1 tablet (25 mg total) by mouth daily for 12  days.    Dispense:  12 tablet    Refill:  0    Patient Instructions  Medication Instructions:  Atenolol taper instructions: Take 50 mg (1/2 tab) daily for one week Then take 25 mg daily for one week (new pill) Then take 12.5 mg daily for one week (1/2 of new pill) Then take 12.5 mg every other day for 3 doses (6 days) Then stop   (Will send 12 pills of 25 mg atenolol)  *If you need a refill on your cardiac medications before your next appointment, please call your pharmacy*  Lab Work: Your physician recommends that you return for lab work today (  A1C, BMP, Lipid)  If you have labs (blood work) drawn today and your tests are completely normal, you will receive your results only by:  [place] (if you have MyChart) OR  A paper copy in the mail If you have any lab test that is abnormal or we need to change your treatment, we will call you to review the results.  Testing/Procedures: None  Follow-Up: At [facility], you and your health needs are our priority.  As part of our continuing mission to provide you with exceptional heart care, we have created designated Provider Care Teams.  These Care Teams include your primary Cardiologist (physician) and Advanced Practice Providers (APPs -  Physician Assistants and Nurse Practitioners) who all work together to provide you with the care you need, when you need it.  Your next appointment:   1 month(s)  The format for your next appointment:   Virtual Visit   Provider:   Buford Dresser, MD    [place] refers to food and lifestyle choices that are based on the traditions of countries located on the [place]. This way of eating has been shown to help prevent certain conditions and improve outcomes for people who have chronic diseases, like kidney disease and heart disease. What are tips for following this plan? Lifestyle  Cook and eat meals together with your family, when  possible.  Drink enough fluid to keep your urine clear or pale yellow.  Be physically active every day. This includes: ? Aerobic exercise like running or swimming. ? Leisure activities like gardening, walking, or housework.  Get 7-8 hours of sleep each night.  If recommended by your health care provider, drink red wine in moderation. This means 1 glass a day for nonpregnant women and 2 glasses a day for men. A glass of wine equals 5 oz (150 mL). Reading food labels   Check the serving size of packaged foods. For foods such as rice and pasta, the serving size refers to the amount of cooked product, not dry.  Check the total fat in packaged foods. Avoid foods that have saturated fat or trans fats.  Check the ingredients list for added sugars, such as corn syrup. Shopping  At the grocery store, buy most of your food from the areas near the walls of the store. This includes: ? Fresh fruits and vegetables (produce). ? Grains, beans, nuts, and seeds. Some of these may be available in unpackaged forms or large amounts (in bulk). ? Fresh seafood. ? Poultry and eggs. ? Low-fat dairy products.  Buy whole ingredients instead of prepackaged foods.  Buy fresh fruits and vegetables in-season from local farmers markets.  Buy frozen fruits and vegetables in resealable bags.  If you do not have access to quality fresh seafood, buy precooked frozen shrimp or canned fish, such as tuna, salmon, or sardines.  Buy small amounts of raw or cooked vegetables, salads, or olives from the deli or salad bar at your store.  Stock your pantry so you always have certain foods on hand, such as olive oil, canned tuna, canned tomatoes, rice, pasta, and beans. Cooking  Cook foods with extra-virgin olive oil instead of using butter or other vegetable oils.  Have meat as a side dish, and have vegetables or grains as your main dish. This means having meat in small portions or adding small amounts of meat to  foods like pasta or stew.  Use beans or vegetables instead of meat in common dishes like  chili or lasagna.  Experiment with different cooking methods. Try roasting or broiling vegetables instead of steaming or sauteing them.  Add frozen vegetables to soups, stews, pasta, or rice.  Add nuts or seeds for added healthy fat at each meal. You can add these to yogurt, salads, or vegetable dishes.  Marinate fish or vegetables using olive oil, lemon juice, garlic, and fresh herbs. Meal planning   Plan to eat 1 vegetarian meal one day each week. Try to work up to 2 vegetarian meals, if possible.  Eat seafood 2 or more times a week.  Have healthy snacks readily available, such as: ? Vegetable sticks with hummus. ? [place] yogurt. ? Fruit and nut trail mix.  Eat balanced meals throughout the week. This includes: ? Fruit: 2-3 servings a day ? Vegetables: 4-5 servings a day ? Low-fat dairy: 2 servings a day ? Fish, poultry, or lean meat: 1 serving a day ? Beans and legumes: 2 or more servings a week ? Nuts and seeds: 1-2 servings a day ? Whole grains: 6-8 servings a day ? Extra-virgin olive oil: 3-4 servings a day  Limit red meat and sweets to only a few servings a month What are my food choices?  Mediterranean diet ? Recommended  Grains: Whole-grain pasta. Brown rice. Bulgar wheat. Polenta. Couscous. Whole-wheat bread. [place].  Vegetables: Artichokes. Beets. Broccoli. Cabbage. Carrots. Eggplant. Green beans. Chard. Kale. Spinach. Onions. Leeks. Peas. Squash. Tomatoes. Peppers. Radishes.  Fruits: Apples. Apricots. Avocado. Berries. Bananas. Cherries. Dates. Figs. Grapes. Lemons. Melon. Oranges. Peaches. Plums. Pomegranate.  Meats and other protein foods: Beans. Almonds. Sunflower seeds. Pine nuts. Peanuts. [place]. Salmon. Scallops. Shrimp. [place]. Tilapia. Clams. Oysters. Eggs.  Dairy: Low-fat milk. Cheese. Greek yogurt.  Beverages: Water. Red wine. Herbal tea.  Fats and  oils: Extra virgin olive oil. Avocado oil. Grape seed oil.  Sweets and desserts: [place] yogurt with honey. Baked apples. Poached pears. Trail mix.  Seasoning and other foods: Basil. Cilantro. Coriander. Cumin. Mint. Parsley. Sage. Rosemary. Tarragon. Garlic. Oregano. Thyme. Pepper. Balsalmic vinegar. Tahini. Hummus. Tomato sauce. Olives. Mushrooms. ? Limit these  Grains: Prepackaged pasta or rice dishes. Prepackaged cereal with added sugar.  Vegetables: Deep fried potatoes (french fries).  Fruits: Fruit canned in syrup.  Meats and other protein foods: Beef. Pork. Lamb. Poultry with skin. Hot dogs. [place] Salines.  Dairy: Ice cream. Sour cream. Whole milk.  Beverages: Juice. Sugar-sweetened soft drinks. Beer. Liquor and spirits.  Fats and oils: Butter. Canola oil. Vegetable oil. Beef fat (tallow). Lard.  Sweets and desserts: Cookies. Cakes. Pies. Candy.  Seasoning and other foods: Mayonnaise. Premade sauces and marinades. The items listed may not be a complete list. Talk with your dietitian about what dietary choices are right for you. Summary  The Mediterranean diet includes both food and lifestyle choices.  Eat a variety of fresh fruits and vegetables, beans, nuts, seeds, and whole grains.  Limit the amount of red meat and sweets that you eat.  Talk with your health care provider about whether it is safe for you to drink red wine in moderation. This means 1 glass a day for nonpregnant women and 2 glasses a day for men. A glass of wine equals 5 oz (150 mL). This information is not intended to replace advice given to you by your health care provider. Make sure you discuss any questions you have with your health care provider. Document Revised: 02/29/2016 Document Reviewed: 02/22/2016 Elsevier Patient Education  2020 [place], [place],  MD PhD 08/12/2019 6:36 PM    [address]

## 2019-08-12 NOTE — Patient Instructions (Addendum)
Medication Instructions:  Atenolol taper instructions: Take 50 mg (1/2 tab) daily for one week Then take 25 mg daily for one week (new pill) Then take 12.5 mg daily for one week (1/2 of new pill) Then take 12.5 mg every other day for 3 doses (6 days) Then stop   (Will send 12 pills of 25 mg atenolol)  *If you need a refill on your cardiac medications before your next appointment, please call your pharmacy*  Lab Work: Your physician recommends that you return for lab work today ( A1C, BMP, Lipid)  If you have labs (blood work) drawn today and your tests are completely normal, you will receive your results only by: Marland Kitchen MyChart Message (if you have MyChart) OR . A paper copy in the mail If you have any lab test that is abnormal or we need to change your treatment, we will call you to review the results.  Testing/Procedures: None  Follow-Up: At [facility], you and your health needs are our priority.  As part of our continuing mission to provide you with exceptional heart care, we have created designated Provider Care Teams.  These Care Teams include your primary Cardiologist (physician) and Advanced Practice Providers (APPs -  Physician Assistants and Nurse Practitioners) who all work together to provide you with the care you need, when you need it.  Your next appointment:   1 month(s)  The format for your next appointment:   Virtual Visit   Provider:   Buford Dresser, MD    [place] refers to food and lifestyle choices that are based on the traditions of countries located on the [place]. This way of eating has been shown to help prevent certain conditions and improve outcomes for people who have chronic diseases, like kidney disease and heart disease. What are tips for following this plan? Lifestyle  Cook and eat meals together with your family, when possible.  Drink enough fluid to keep your urine clear or pale yellow.  Be  physically active every day. This includes: ? Aerobic exercise like running or swimming. ? Leisure activities like gardening, walking, or housework.  Get 7-8 hours of sleep each night.  If recommended by your health care provider, drink red wine in moderation. This means 1 glass a day for nonpregnant women and 2 glasses a day for men. A glass of wine equals 5 oz (150 mL). Reading food labels   Check the serving size of packaged foods. For foods such as rice and pasta, the serving size refers to the amount of cooked product, not dry.  Check the total fat in packaged foods. Avoid foods that have saturated fat or trans fats.  Check the ingredients list for added sugars, such as corn syrup. Shopping  At the grocery store, buy most of your food from the areas near the walls of the store. This includes: ? Fresh fruits and vegetables (produce). ? Grains, beans, nuts, and seeds. Some of these may be available in unpackaged forms or large amounts (in bulk). ? Fresh seafood. ? Poultry and eggs. ? Low-fat dairy products.  Buy whole ingredients instead of prepackaged foods.  Buy fresh fruits and vegetables in-season from local farmers markets.  Buy frozen fruits and vegetables in resealable bags.  If you do not have access to quality fresh seafood, buy precooked frozen shrimp or canned fish, such as tuna, salmon, or sardines.  Buy small amounts of raw or cooked vegetables, salads, or olives from the deli or  salad bar at your store.  Stock your pantry so you always have certain foods on hand, such as olive oil, canned tuna, canned tomatoes, rice, pasta, and beans. Cooking  Cook foods with extra-virgin olive oil instead of using butter or other vegetable oils.  Have meat as a side dish, and have vegetables or grains as your main dish. This means having meat in small portions or adding small amounts of meat to foods like pasta or stew.  Use beans or vegetables instead of meat in common  dishes like chili or lasagna.  Experiment with different cooking methods. Try roasting or broiling vegetables instead of steaming or sauteing them.  Add frozen vegetables to soups, stews, pasta, or rice.  Add nuts or seeds for added healthy fat at each meal. You can add these to yogurt, salads, or vegetable dishes.  Marinate fish or vegetables using olive oil, lemon juice, garlic, and fresh herbs. Meal planning   Plan to eat 1 vegetarian meal one day each week. Try to work up to 2 vegetarian meals, if possible.  Eat seafood 2 or more times a week.  Have healthy snacks readily available, such as: ? Vegetable sticks with hummus. ? [place] yogurt. ? Fruit and nut trail mix.  Eat balanced meals throughout the week. This includes: ? Fruit: 2-3 servings a day ? Vegetables: 4-5 servings a day ? Low-fat dairy: 2 servings a day ? Fish, poultry, or lean meat: 1 serving a day ? Beans and legumes: 2 or more servings a week ? Nuts and seeds: 1-2 servings a day ? Whole grains: 6-8 servings a day ? Extra-virgin olive oil: 3-4 servings a day  Limit red meat and sweets to only a few servings a month What are my food choices?  Mediterranean diet ? Recommended  Grains: Whole-grain pasta. Brown rice. Bulgar wheat. Polenta. Couscous. Whole-wheat bread. [place].  Vegetables: Artichokes. Beets. Broccoli. Cabbage. Carrots. Eggplant. Green beans. Chard. Kale. Spinach. Onions. Leeks. Peas. Squash. Tomatoes. Peppers. Radishes.  Fruits: Apples. Apricots. Avocado. Berries. Bananas. Cherries. Dates. Figs. Grapes. Lemons. Melon. Oranges. Peaches. Plums. Pomegranate.  Meats and other protein foods: Beans. Almonds. Sunflower seeds. Pine nuts. Peanuts. [place]. Salmon. Scall[place]. Shrimp. [place]. Tilapia. Clams. Oysters. Eggs.  Dairy: Low-fat milk. Cheese. Greek yogurt.  Beverages: Water. Red wine. Herbal tea.  Fats and oils: Extra virgin olive oil. Avocado oil. Grape seed oil.  Sweets and desserts:  [place] yogurt with honey. Baked apples. Poached pears. Trail mix.  Seasoning and other foods: Basil. Cilantro. Coriander. Cumin. Mint. Parsley. Sage. Rosemary. Tarragon. Garlic. Oregano. Thyme. Pepper. Balsalmic vinegar. Tahini. Hummus. Tomato sauce. Olives. Mushrooms. ? Limit these  Grains: Prepackaged pasta or rice dishes. Prepackaged cereal with added sugar.  Vegetables: Deep fried potatoes (french fries).  Fruits: Fruit canned in syrup.  Meats and other protein foods: Beef. Pork. Lamb. Poultry with skin. Hot dogs. [place] Salines.  Dairy: Ice cream. Sour cream. Whole milk.  Beverages: Juice. Sugar-sweetened soft drinks. Beer. Liquor and spirits.  Fats and oils: Butter. Canola oil. Vegetable oil. Beef fat (tallow). Lard.  Sweets and desserts: Cookies. Cakes. Pies. Candy.  Seasoning and other foods: Mayonnaise. Premade sauces and marinades. The items listed may not be a complete list. Talk with your dietitian about what dietary choices are right for you. Summary  The Mediterranean diet includes both food and lifestyle choices.  Eat a variety of fresh fruits and vegetables, beans, nuts, seeds, and whole grains.  Limit the amount of red meat and sweets that you eat.  Talk with your health care provider about whether it is safe for you to drink red wine in moderation. This means 1 glass a day for nonpregnant women and 2 glasses a day for men. A glass of wine equals 5 oz (150 mL). This information is not intended to replace advice given to you by your health care provider. Make sure you discuss any questions you have with your health care provider. Document Revised: 02/29/2016 Document Reviewed: 02/22/2016 Elsevier Patient Education  [place].

## 2019-08-13 ENCOUNTER — Telehealth: Payer: Self-pay | Admitting: Cardiology

## 2019-08-13 LAB — BASIC METABOLIC PANEL
BUN/Creatinine Ratio: 15 (ref 9–23)
BUN: 15 mg/dL (ref 6–24)
CO2: 25 mmol/L (ref 20–29)
Calcium: 10.3 mg/dL — ABNORMAL HIGH (ref 8.7–10.2)
Chloride: 101 mmol/L (ref 96–106)
Creatinine, Ser: 0.98 mg/dL (ref 0.57–1.00)
GFR calc Af Amer: 74 mL/min/{1.73_m2} (ref >59)
GFR calc non Af Amer: 64 mL/min/{1.73_m2} (ref >59)
Glucose: 92 mg/dL (ref 65–99)
Potassium: 4.2 mmol/L (ref 3.5–5.2)
Sodium: 138 mmol/L (ref 134–144)

## 2019-08-13 LAB — LIPID PANEL
Chol/HDL Ratio: 4.3 ratio (ref 0.0–4.4)
Cholesterol, Total: 213 mg/dL — ABNORMAL HIGH (ref 100–199)
HDL: 50 mg/dL (ref >39)
LDL Chol Calc (NIH): 140 mg/dL — ABNORMAL HIGH (ref 0–99)
Triglycerides: 130 mg/dL (ref 0–149)
VLDL Cholesterol Cal: 23 mg/dL (ref 5–40)

## 2019-08-13 LAB — HEMOGLOBIN A1C
Est. average glucose Bld gHb Est-mCnc: 105 mg/dL
Hgb A1c MFr Bld: 5.3 % (ref 4.8–5.6)

## 2019-08-13 NOTE — Telephone Encounter (Signed)
Follow Up:   Returning Angelica Wells's call from today, concerning her lab results.

## 2019-08-13 NOTE — Telephone Encounter (Signed)
Called patient- and advised of lab work.  Patient verbalized understanding.

## 2019-08-18 NOTE — Addendum Note (Signed)
Addended by: Merri Ray A on: 08/18/2019 12:32 PM   Modules accepted: Orders

## 2019-09-01 ENCOUNTER — Telehealth: Payer: Self-pay | Admitting: Cardiology

## 2019-09-01 NOTE — Telephone Encounter (Signed)
Returned call to patient she stated she felt tired today no energy,no chest pain.She checked her B/P.Reading listed below.Pulse in 70's.Stated she has not missed taking her medications.She has been eating alittle salt.Advised of a low salt diet.Advised I will send message to Dr.Christopher for advice.

## 2019-09-01 NOTE — Telephone Encounter (Signed)
New message  Pt c/o BP issue: STAT if pt c/o blurred vision, one-sided weakness or slurred speech  1. What are your last 5 BP readings? 185/93  176/91  191/102   2. Are you having any other symptoms (ex. Dizziness, headache, blurred vision, passed out)? tired  3. What is your BP issue? Patient states that her b/p is elevated.

## 2019-09-02 MED ORDER — HYDROCHLOROTHIAZIDE 25 MG PO TABS: 25.0000 mg | ORAL_TABLET | Freq: Every day | ORAL | 3 refills | Status: DC

## 2019-09-02 MED ORDER — LISINOPRIL 20 MG PO TABS: 20.0000 mg | ORAL_TABLET | Freq: Every day | ORAL | 3 refills | Status: DC

## 2019-09-02 NOTE — Telephone Encounter (Signed)
She will increase Lisinopril to 20 mg daily and HCTZ to 25 mg daily.Advised they do not make combination Lisinopril/HCTZ 20/25 mg.New prescription sent to pharmacy.

## 2019-09-02 NOTE — Telephone Encounter (Addendum)
Spoke to patient Dr.Cristopher's advice given.Advised to continue Atenolol 50 mg daily as long as heart rate 50 or greater.She will increase Lisinopril/Hctz 20/25 mg daily.Advised to continue to monitor B/P.Keep virtual appointment with Dr.Christopher 2/25 at 8:40 am.Advised to call back if needed.

## 2019-09-02 NOTE — Telephone Encounter (Signed)
Spoke to patient Dr.Christopher's advice given.She stated she had to call Dr.on call last night B/P 200/118.Stated she took Atenolol 50 mg earlier and was advised to take another 50 mg.Stated at present B/P 174/110.She wants to ask Dr.Christopher if she can take Atenolol 50 mg daily until her phone visit next week.Message sent to Dr.Christopher.

## 2019-09-02 NOTE — Telephone Encounter (Signed)
It is fine to continue atenolol 50 mg daily as long as her heart rate does not consistently drop too low (like <50 bpm). She can also double her lisinopril-HCTZ. I have this dose as lisinopril 10 mg-HCTZ 12.5 mg. She can take lisinopril 20-HCTZ 25 daily until we see her. Recent labs were fine. Thanks.

## 2019-09-02 NOTE — Telephone Encounter (Signed)
Agree with watching salt, monitoring blood pressure. We will follow up next week and discuss next steps. Thanks.

## 2019-09-09 ENCOUNTER — Telehealth (INDEPENDENT_AMBULATORY_CARE_PROVIDER_SITE_OTHER): Payer: 59 | Admitting: Cardiology

## 2019-09-09 VITALS — BP 133/89 | Ht 62.0 in | Wt 188.0 lb

## 2019-09-09 DIAGNOSIS — Z8249 Family history of ischemic heart disease and other diseases of the circulatory system: Secondary | ICD-10-CM | POA: Diagnosis not present

## 2019-09-09 DIAGNOSIS — Z713 Dietary counseling and surveillance: Secondary | ICD-10-CM | POA: Diagnosis not present

## 2019-09-09 DIAGNOSIS — Z7182 Exercise counseling: Secondary | ICD-10-CM

## 2019-09-09 DIAGNOSIS — Z7189 Other specified counseling: Secondary | ICD-10-CM

## 2019-09-09 DIAGNOSIS — I10 Essential (primary) hypertension: Secondary | ICD-10-CM | POA: Diagnosis not present

## 2019-09-09 NOTE — Patient Instructions (Signed)
Medication Instructions:  Your Physician recommend you continue on your current medication as directed.    *If you need a refill on your cardiac medications before your next appointment, please call your pharmacy*  Lab Work: None  Testing/Procedures: None  Follow-Up: At [facility], you and your health needs are our priority.  As part of our continuing mission to provide you with exceptional heart care, we have created designated Provider Care Teams.  These Care Teams include your primary Cardiologist (physician) and Advanced Practice Providers (APPs -  Physician Assistants and Nurse Practitioners) who all work together to provide you with the care you need, when you need it.  Your next appointment:   2 month(s)  The format for your next appointment:   Either In Person or Virtual  Provider:   Buford Dresser, MD

## 2019-09-09 NOTE — Progress Notes (Signed)
Virtual Visit via Telephone Note   This visit type was conducted due to national recommendations for restrictions regarding the COVID-19 Pandemic (e.g. social distancing) in an effort to limit this patient's exposure and mitigate transmission in our community.  Due to her co-morbid illnesses, this patient is at least at moderate risk for complications without adequate follow up.  This format is felt to be most appropriate for this patient at this time.  The patient did not have access to video technology/had technical difficulties with video requiring transitioning to audio format only (telephone).  All issues noted in this document were discussed and addressed.  No physical exam could be performed with this format.  Please refer to the patient's chart for her  consent to telehealth for [facility].   Date:  09/09/2019   ID:  Angelica Wells, DOB [birth year], MRN [id number]  Patient Location: Home Provider Location: Home  PCP:  Chesley Noon, MD  Cardiologist:  Buford Dresser, MD  Electrophysiologist:  None   Evaluation Performed:  Follow-Up Visit  Chief Complaint:  Follow up  History of Present Illness:    Angelica Wells is a 59 y.o. female with PMH chest pain and palpitations.  The patient does not have symptoms concerning for COVID-19 infection (fever, chills, cough, or new shortness of breath).   Today: Blood pressure has been very labile. 133/89 this AM, last night 134/85. 149/92 after stressful day at work. Highest number 203/114 (prior to medication adjustment), many in the Q000111Q systolic. After medications were adjusted, has been more [id number] systolic.   Fatigue was improved with coming down on dose of atenolol, best when she came down from 100 mg to 25 mg dose. Now back on 50 mg dose and tolerating. Did notice some irritability when tapering down, discussed how beta blockers can insulate against stress/anxiety response.  Has been very sedentary. Discussed  increased walking, how this improves blood pressure and stress.  Working on avoiding salt. Doesn't eat restaurant food, avoiding using salt.   Denies shortness of breath at rest or with normal exertion. No PND, orthopnea, LE edema or unexpected weight gain. No syncope or palpitations.   Past Medical History:  Diagnosis Date  . Aortic regurgitation   . Eczema   . Eosinophilic esophagitis [id number]  . Esophageal stricture   . Exercise-induced asthma   . GERD (gastroesophageal reflux disease)   . History of uterine fibroid    ut10x7x6 cm   . Hypertension   . Migraine without aura 05/2005   Past Surgical History:  Procedure Laterality Date  . ABDOMINOPLASTY  2000  . ABLATION  01/06/2004   HTA  . BREAST REDUCTION SURGERY    . [place]  . MYOMECTOMY  1995  . TONSILLECTOMY    . uterine ablation       Current Meds  Medication Sig  . atenolol (TENORMIN) 50 MG tablet Take 50 mg daily  . calcium-vitamin D (OSCAL WITH D) 500-200 MG-UNIT per tablet Take 1 tablet by mouth daily.    . cetirizine (ZYRTEC) 10 MG tablet Take 10 mg by mouth as needed.   . clobetasol cream (TEMOVATE) 0.05 % Apply topically 2 (two) times daily as needed.  Marland Kitchen estradiol (ESTRACE VAGINAL) 0.1 MG/GM vaginal cream USE 1 GRAM VAGINALLY 2 TIMES A WEEK AT BEDTIME  . hydrochlorothiazide (HYDRODIURIL) 25 MG tablet Take 1 tablet (25 mg total) by mouth daily.  Marland Kitchen lisinopril (ZESTRIL) 20 MG tablet Take 1 tablet (20 mg total)  by mouth daily.  Marland Kitchen oxybutynin (DITROPAN-XL) 5 MG 24 hr tablet TAKE 1 TABLET BY MOUTH AT  BEDTIME  . pantoprazole (PROTONIX) 40 MG tablet TAKE 1 TABLET DAILY  . zolmitriptan (ZOMIG) 5 MG tablet Take 1 tablet (5 mg total) by mouth as needed for migraine.     Allergies:   Sulfonamide derivatives   Social History   Tobacco Use  . Smoking status: Never Smoker  . Smokeless tobacco: Never Used  Substance Use Topics  . Alcohol use: Yes    Alcohol/week: 2.0 standard drinks    Types: 2  Standard drinks or equivalent per week  . Drug use: No     Family Hx: The patient's family history includes Bladder Cancer in her father; Breast cancer (age of onset: 69) in her maternal grandmother; Hypertension in her father and mother; Migraines in her mother; Other in her mother.  ROS:   Please see the history of present illness.    All other systems reviewed and are negative.   Prior CV studies:   The following studies were reviewed today: No new since last visit  Labs/Other Tests and Data Reviewed:    EKG:  An ECG dated 08/12/19 was personally reviewed today and demonstrated:  NSR, lack of R wave V1-V2  Recent Labs: 08/12/2019: BUN 15; Creatinine, Ser 0.98; Potassium 4.2; Sodium 138   Recent Lipid Panel Lab Results  Component Value Date/Time   CHOL 213 (H) 08/12/2019 03:46 PM   TRIG 130 08/12/2019 03:46 PM   HDL 50 08/12/2019 03:46 PM   CHOLHDL 4.3 08/12/2019 03:46 PM   CHOLHDL 4 07/26/2011 09:56 AM   LDLCALC 140 (H) 08/12/2019 03:46 PM    Wt Readings from Last 3 Encounters:  09/09/19 188 lb (85.3 kg)  08/12/19 198 lb (89.8 kg)  12/02/17 188 lb (85.3 kg)     Objective:    Vital Signs:  BP 133/89   Ht 5\' 2"  (1.575 m)   Wt 188 lb (85.3 kg)   BMI 34.39 kg/m    Speaking comfortably on the phone, no audible wheezing In no acute distress Alert and oriented Normal affect Normal speech  ASSESSMENT & PLAN:    Hypertension: had been on 100 mg atenolol for decades, and with fatigue, we attempted to taper. However, BP rose. Much better controlled now on current regimen -continue increased doses of lisinopril 20 mg daily and HCTZ 25 mg daily -discussed options. For now, will continue 50 mg daily atenolol. Ok to drop to 25 mg daily dose if her BP remains stable. Will monitor, consider taper again in the future given her fatigue. -will give medications two months to stablize, then will check in and talk about next steps  CV risk, with family history of heart  disease: Extensive counseling done today. See below re: nutrition and exercise. We had discussed treadmill stress at prior visit, but now focused on increasing exercise -recommend heart healthy/Mediterranean diet, with whole grains, fruits, vegetable, fish, lean meats, nuts, and olive oil. Limit salt. -recommend moderate walking, 3-5 times/week for 30-50 minutes each session. Aim for at least 150 minutes.week. Goal should be pace of 3 miles/hours, or walking 1.5 miles in 30 minutes. We focused on exercise conditioning almost as a natural beta blocker, may allow [place] to wean atenolol more easily in the future. -recommend avoidance of tobacco products. Avoid excess alcohol. -ASCVD risk score: The 10-year ASCVD risk score Mikey Bussing DC Jr., et al., 2013) is: 4%   Values used to calculate the score:  Age: 82 years     Sex: Female     Is Non-Hispanic African American: No     Diabetic: No     Tobacco smoker: No     Systolic Blood Pressure: Q000111Q mmHg     Is BP treated: Yes     HDL Cholesterol: 50 mg/dL     Total Cholesterol: 213 mg/dL   COVID-19 Education: The signs and symptoms of COVID-19 were discussed with the patient and how to seek care for testing (follow up with PCP or arrange E-visit).  The importance of social distancing was discussed today.  Time:   Today, I have spent 16 minutes with the patient with telehealth technology discussing the above problems.  Additional time for chart review and documentation.  Patient Instructions  Medication Instructions:  Your Physician recommend you continue on your current medication as directed.    *If you need a refill on your cardiac medications before your next appointment, please call your pharmacy*  Lab Work: None  Testing/Procedures: None  Follow-Up: At [facility], you and your health needs are our priority.  As part of our continuing mission to provide you with exceptional heart care, we have created designated Provider Care Teams.   These Care Teams include your primary Cardiologist (physician) and Advanced Practice Providers (APPs -  Physician Assistants and Nurse Practitioners) who all work together to provide you with the care you need, when you need it.  Your next appointment:   2 month(s)  The format for your next appointment:   Either In Person or Virtual  Provider:   Buford Dresser, MD     Signed, Buford Dresser, MD  09/09/2019  [place]

## 2019-09-10 ENCOUNTER — Encounter: Payer: Self-pay | Admitting: Cardiology

## 2019-11-10 ENCOUNTER — Other Ambulatory Visit: Payer: Self-pay

## 2019-11-10 ENCOUNTER — Encounter: Payer: Self-pay | Admitting: Cardiology

## 2019-11-10 ENCOUNTER — Ambulatory Visit: Payer: 59 | Admitting: Cardiology

## 2019-11-10 VITALS — BP 118/72 | HR 68 | Temp 97.3°F | Ht 63.0 in | Wt 189.0 lb

## 2019-11-10 DIAGNOSIS — R072 Precordial pain: Secondary | ICD-10-CM

## 2019-11-10 DIAGNOSIS — Z7189 Other specified counseling: Secondary | ICD-10-CM

## 2019-11-10 DIAGNOSIS — I10 Essential (primary) hypertension: Secondary | ICD-10-CM | POA: Diagnosis not present

## 2019-11-10 DIAGNOSIS — Z8249 Family history of ischemic heart disease and other diseases of the circulatory system: Secondary | ICD-10-CM

## 2019-11-10 NOTE — Progress Notes (Signed)
Cardiology Office Note:    Date:  11/10/2019   ID:  Angelica Wells, DOB 08-10-1960, MRN [id number]  PCP:  Chesley Noon, MD  Cardiologist:  Buford Dresser, MD  Referring MD: Chesley Noon, MD   CC: new patient consultation for chest pain  History of Present Illness:    Angelica Wells is a 59 y.o. female with a hx of chest pain and palpitations seen for follow up today. I initially met her 08/12/2019 as new consult at the request of Chesley Noon, MD for the evaluation and management of chest pain and palpitations.  Today: Gotten both Covid shots, did well with them.   Has gained weight with the pandemic, about 12 lbs. Has been very careful, not left the house. This has decreased her activity level.  Blood pressure well controlled today. Not taking regularly at home.   Cough has resolved. Chest pain also decreased before, has changed her diet intake. Not exertional. Longest 15 minutes, right sided, better when she got up and moved. Better since cutting back on tea. Palpitations also improved.  Denies shortness of breath at rest or with normal exertion. No PND, orthopnea, LE edema or unexpected weight gain. No syncope.  Past Medical History:  Diagnosis Date  . Aortic regurgitation   . Eczema   . Eosinophilic esophagitis 96/29  . Esophageal stricture   . Exercise-induced asthma   . GERD (gastroesophageal reflux disease)   . History of uterine fibroid    ut10x7x6 cm   . Hypertension   . Migraine without aura 05/2005    Past Surgical History:  Procedure Laterality Date  . ABDOMINOPLASTY  2000  . ABLATION  01/06/2004   HTA  . BREAST REDUCTION SURGERY    . [place]  . MYOMECTOMY  1995  . TONSILLECTOMY    . uterine ablation      Current Medications: Current Outpatient Medications on File Prior to Visit  Medication Sig  . atenolol (TENORMIN) 50 MG tablet Take 50 mg daily  . calcium-vitamin D (OSCAL WITH D) 500-200 MG-UNIT per  tablet Take 1 tablet by mouth daily.    . cetirizine (ZYRTEC) 10 MG tablet Take 10 mg by mouth as needed.   . clobetasol cream (TEMOVATE) 0.05 % Apply topically 2 (two) times daily as needed.  Marland Kitchen estradiol (ESTRACE VAGINAL) 0.1 MG/GM vaginal cream USE 1 GRAM VAGINALLY 2 TIMES A WEEK AT BEDTIME  . hydrochlorothiazide (HYDRODIURIL) 25 MG tablet Take 1 tablet (25 mg total) by mouth daily.  Marland Kitchen lisinopril (ZESTRIL) 20 MG tablet Take 1 tablet (20 mg total) by mouth daily.  Marland Kitchen oxybutynin (DITROPAN-XL) 5 MG 24 hr tablet TAKE 1 TABLET BY MOUTH AT  BEDTIME  . pantoprazole (PROTONIX) 40 MG tablet TAKE 1 TABLET DAILY  . zolmitriptan (ZOMIG) 5 MG tablet Take 1 tablet (5 mg total) by mouth as needed for migraine.   No current facility-administered medications on file prior to visit.     Allergies:   Sulfonamide derivatives   Social History   Tobacco Use  . Smoking status: Never Smoker  . Smokeless tobacco: Never Used  Substance Use Topics  . Alcohol use: Yes    Alcohol/week: 2.0 standard drinks    Types: 2 Standard drinks or equivalent per week  . Drug use: No    Family History: family history includes Bladder Cancer in her father; Breast cancer (age of onset: 36) in her maternal grandmother; Hypertension in her father and mother; Migraines  in her mother; Other in her mother.  ROS:   Please see the history of present illness.  Additional pertinent ROS otherwise unremarkable.   EKGs/Labs/Other Studies Reviewed:    The following studies were reviewed today: Echo report 2012  EKG:  EKG is personally reviewed.  The ekg ordered 08/12/2019 demonstrates NSR, no R wave V1-V2.  Recent Labs: 08/12/2019: BUN 15; Creatinine, Ser 0.98; Potassium 4.2; Sodium 138  Recent Lipid Panel    Component Value Date/Time   CHOL 213 (H) 08/12/2019 1546   TRIG 130 08/12/2019 1546   HDL 50 08/12/2019 1546   CHOLHDL 4.3 08/12/2019 1546   CHOLHDL 4 07/26/2011 0956   VLDL 16.4 07/26/2011 0956   LDLCALC 140 (H)  08/12/2019 1546    Physical Exam:    VS:  BP 118/72 (BP Location: Left Arm, Patient Position: Sitting, Cuff Size: Normal)   Pulse 68   Temp (!) 97.3 F (36.3 C)   Ht '5\' 3"'$  (1.6 m)   Wt 189 lb (85.7 kg)   BMI 33.48 kg/m     Wt Readings from Last 3 Encounters:  11/10/19 189 lb (85.7 kg)  09/09/19 188 lb (85.3 kg)  08/12/19 198 lb (89.8 kg)    GEN: Well nourished, well developed in no acute distress HEENT: Normal, moist mucous membranes NECK: No JVD CARDIAC: regular rhythm, normal S1 and S2, no rubs or gallops. No murmur. VASCULAR: Radial and DP pulses 2+ bilaterally. No carotid bruits RESPIRATORY:  Clear to auscultation without rales, wheezing or rhonchi  ABDOMEN: Soft, non-tender, non-distended MUSCULOSKELETAL:  Ambulates independently SKIN: Warm and dry, no edema NEUROLOGIC:  Alert and oriented x 3. No focal neuro deficits noted. PSYCHIATRIC:  Normal affect   ASSESSMENT:    1. Precordial pain   2. Essential hypertension   3. Family history of heart disease   4. Cardiac risk counseling   5. Counseling on health promotion and disease prevention    PLAN:    Precordial pain: right sided, atypical. Now improving -previously discussed treadmill stress with CTA if treadmill abnormal -symptoms are improving, will continue to monitor  Hypertension: longstanding, since her 11s. At goal today. -atenolol dose has been 100 mg daily for many years -attempted long taper, but BP rose. Doing well on 50 mg daily dose. Consider decreasing again in the future given prior fatigue -on lisinopril 20 mg and HCTZ 25 mg daily, continue -reviewed home BP monitoring  Cardiac risk counseling and prevention recommendations: especially given family history of heart diseae -recommend heart healthy/Mediterranean diet, with whole grains, fruits, vegetable, fish, lean meats, nuts, and olive oil. Limit salt. -recommend moderate walking, 3-5 times/week for 30-50 minutes each session. Aim for at least  150 minutes.week. Goal should be pace of 3 miles/hours, or walking 1.5 miles in 30 minutes -recommend avoidance of tobacco products. Avoid excess alcohol. -Additional risk factor control:  -Diabetes risk: A1c is not available, she is not sure this was ever done. Has significant risk factors for metabolic syndrome/diabetes. Screen today  -Lipids: 08/12/19 lipids reviewed above  -Blood pressure control: as above  -Weight: BMI 33, would benefit from weight loss.  Plan for follow up: 1 year or sooner as needed  Buford Dresser, MD, PhD [place]  [facility]    Medication Adjustments/Labs and Tests Ordered: Current medicines are reviewed at length with the patient today.  Concerns regarding medicines are outlined above.  No orders of the defined types were placed in this encounter.  No orders of the defined types  were placed in this encounter.   Patient Instructions  Medication Instructions:  Your Physician recommend you continue on your current medication as directed.    *If you need a refill on your cardiac medications before your next appointment, please call your pharmacy*   Lab Work: None  Testing/Procedures: None   Follow-Up: At [facility], you and your health needs are our priority.  As part of our continuing mission to provide you with exceptional heart care, we have created designated Provider Care Teams.  These Care Teams include your primary Cardiologist (physician) and Advanced Practice Providers (APPs -  Physician Assistants and Nurse Practitioners) who all work together to provide you with the care you need, when you need it.  We recommend signing up for the patient portal called "MyChart".  Sign up information is provided on this After Visit Summary.  MyChart is used to connect with patients for Virtual Visits (Telemedicine).  Patients are able to view lab/test results, encounter notes, upcoming appointments, etc.  Non-urgent messages can be sent to your  provider as well.   To learn more about what you can do with MyChart, go to [email or web address].    Your next appointment:   1 year(s)  The format for your next appointment:   In Person  Provider:   Buford Dresser, MD      Signed, Buford Dresser, MD PhD 11/10/2019 4:29 PM    [place]

## 2019-11-10 NOTE — Patient Instructions (Signed)

## 2019-12-19 ENCOUNTER — Encounter: Payer: Self-pay | Admitting: Cardiology

## 2020-01-28 ENCOUNTER — Telehealth: Payer: Self-pay | Admitting: Obstetrics and Gynecology

## 2020-01-28 NOTE — Telephone Encounter (Signed)
Spoke with patient. Patient states she was tx for sinus infection w/ PO abx x10 days, completed medication 3 days ago, prescribed by [facility].  Patient reports external vaginal itching and small amount of white vaginal d/c, is unsure of odor, symptoms started 4 days ago. Unrelieved by OTC cortisone cream and monistat 1 day. Patient is requesting an Rx. Advised patient OV needed for further evaluation, offered OV on 7/27 with Dr. Sabra Heck, patient declined. Reviewed option of trying OTC monistat 3 or 7 day and applying some of the cream externally, if symptoms do not resolve or new symptoms develop, return call to the office for OV. Also reviewed option of contacting provider that prescribed Abx or PCP. Advised patient Dr. Talbert Nan is out of the office, covering provider will review, our office will return call if any additional recommendations. Patient agreeable.   Last AEX 10/20/17 Next AEX 06/21/20  Routing to Dr. Sabra Heck for final review.

## 2020-01-28 NOTE — Telephone Encounter (Signed)
Patient has yeast symptoms. 

## 2020-02-04 ENCOUNTER — Encounter: Payer: Self-pay | Admitting: Obstetrics and Gynecology

## 2020-06-21 ENCOUNTER — Ambulatory Visit (INDEPENDENT_AMBULATORY_CARE_PROVIDER_SITE_OTHER): Payer: 59 | Admitting: Obstetrics and Gynecology

## 2020-06-21 ENCOUNTER — Other Ambulatory Visit: Payer: Self-pay

## 2020-06-21 ENCOUNTER — Encounter: Payer: Self-pay | Admitting: Obstetrics and Gynecology

## 2020-06-21 VITALS — BP 118/68 | HR 68 | Ht 61.5 in | Wt 182.0 lb

## 2020-06-21 DIAGNOSIS — Z01419 Encounter for gynecological examination (general) (routine) without abnormal findings: Secondary | ICD-10-CM

## 2020-06-21 DIAGNOSIS — N318 Other neuromuscular dysfunction of bladder: Secondary | ICD-10-CM

## 2020-06-21 DIAGNOSIS — N952 Postmenopausal atrophic vaginitis: Secondary | ICD-10-CM | POA: Diagnosis not present

## 2020-06-21 DIAGNOSIS — Z6833 Body mass index (BMI) 33.0-33.9, adult: Secondary | ICD-10-CM

## 2020-06-21 DIAGNOSIS — Z0000 Encounter for general adult medical examination without abnormal findings: Secondary | ICD-10-CM | POA: Diagnosis not present

## 2020-06-21 MED ORDER — ESTRADIOL 0.1 MG/GM VA CREA: TOPICAL_CREAM | VAGINAL | 1 refills | Status: DC

## 2020-06-21 MED ORDER — OXYBUTYNIN CHLORIDE ER 5 MG PO TB24: 5.0000 mg | ORAL_TABLET | Freq: Every day | ORAL | 3 refills | Status: DC

## 2020-06-21 NOTE — Patient Instructions (Signed)

## 2020-06-21 NOTE — Progress Notes (Signed)
59 y.o. G38P2002 Married White or Caucasian Not Hispanic or Latino female here for annual exam. No vaginal bleeding. H/O vaginal atrophy, ran out of vaginal estrogen, would like to restart. She has baseline vaginal dryness. Not sexually active secondary to low libido and discomfort.       H/O multiple small fibroids. H/O endometrial ablation.   H/O OAB, was on ditropan which helped. Would like to restart.   No LMP recorded. Patient has had an ablation.          Sexually active: No.  The current method of family planning is none  Exercising: No.  The patient does not participate in regular exercise at present. Smoker:  no  Health Maintenance: Pap: 10/20/17 WNL Neg HR HPV  10-20-13 WNL NEG HR HPV  History of abnormal Pap:  no MMG:  02/04/20 negative, cat A ([facility]) BMD:   None  Colonoscopy: 06-24-14 polyps, over due. She will schedule  TDaP:  03/15/15 Gardasil: NA   reports that she has never smoked. She has never used smokeless tobacco. She reports current alcohol use of about 2.0 standard drinks of alcohol per week. She reports that she does not use drugs.  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   Past Medical History:  Diagnosis Date  . Aortic regurgitation   . Eczema   . Eosinophilic esophagitis 10/21  . Esophageal stricture   . Exercise-induced asthma   . GERD (gastroesophageal reflux disease)   . History of uterine fibroid    ut10x7x6 cm   . Hypertension   . Migraine without aura 05/2005    Past Surgical History:  Procedure Laterality Date  . ABDOMINOPLASTY  2000  . ABLATION  01/06/2004   HTA  . BREAST REDUCTION SURGERY    . [place]  . MYOMECTOMY  1995  . TONSILLECTOMY    . uterine ablation      Current Outpatient Medications  Medication Sig Dispense Refill  . atenolol (TENORMIN) 50 MG tablet Take 50 mg daily 180 tablet 3  . calcium-vitamin D (OSCAL  WITH D) 500-200 MG-UNIT per tablet Take 1 tablet by mouth daily.      . cetirizine (ZYRTEC) 10 MG tablet Take 10 mg by mouth as needed.     . clobetasol cream (TEMOVATE) 0.05 % Apply topically 2 (two) times daily as needed. 45 g 1  . estradiol (ESTRACE VAGINAL) 0.1 MG/GM vaginal cream USE 1 GRAM VAGINALLY 2 TIMES A WEEK AT BEDTIME 42.5 g 1  . oxybutynin (DITROPAN-XL) 5 MG 24 hr tablet Take 1 tablet (5 mg total) by mouth at bedtime. 90 tablet 3  . pantoprazole (PROTONIX) 40 MG tablet TAKE 1 TABLET DAILY 90 tablet 2  . zolmitriptan (ZOMIG) 5 MG tablet Take 1 tablet (5 mg total) by mouth as needed for migraine. 30 tablet 3  . hydrochlorothiazide (HYDRODIURIL) 25 MG tablet Take 1 tablet (25 mg total) by mouth daily. 90 tablet 3  . lisinopril (ZESTRIL) 20 MG tablet Take 1 tablet (20 mg total) by mouth daily. 90 tablet 3   No current facility-administered medications for this visit.    Family History  Problem Relation Age of Onset  . Hypertension Mother   . Other Mother        mvp, bicuspid aortic valve cabg, valve replacement  . Migraines Mother        went away after menopause  .  Bladder Cancer Father   . Hypertension Father   . Breast cancer Maternal Grandmother 56    Review of Systems  All other systems reviewed and are negative.   Exam:   BP 118/68   Pulse 68   Ht 5' 1.5" (1.562 m)   Wt 182 lb (82.6 kg)   SpO2 99%   BMI 33.83 kg/m   Weight change: @WEIGHTCHANGE @ Height:   Height: 5' 1.5" (156.2 cm)  Ht Readings from Last 3 Encounters:  06/21/20 5' 1.5" (1.562 m)  11/10/19 5\' 3"  (1.6 m)  09/09/19 5\' 2"  (1.575 m)    General appearance: alert, cooperative and appears stated age Head: Normocephalic, without obvious abnormality, atraumatic Neck: no adenopathy, supple, symmetrical, trachea midline and thyroid normal to inspection and palpation Lungs: clear to auscultation bilaterally Cardiovascular: regular rate and rhythm Breasts: normal appearance, no masses or  tenderness Abdomen: soft, non-tender; non distended,  no masses,  no organomegaly Extremities: extremities normal, atraumatic, no cyanosis or edema Skin: Skin color, texture, turgor normal. No rashes or lesions Lymph nodes: Cervical, supraclavicular, and axillary nodes normal. No abnormal inguinal nodes palpated Neurologic: Grossly normal   Pelvic: External genitalia:  no lesions              Urethra:  normal appearing urethra with no masses, tenderness or lesions              Bartholins and Skenes: normal                 Vagina: normal appearing vagina with normal color and discharge, no lesions              Cervix: no lesions               Bimanual Exam:  Uterus:  retroverted, top normal sized, irregular (stabe, h/o fibroids)              Adnexa: no mass, fullness, tenderness               Rectovaginal: Confirms               Anus:  normal sphincter tone, no lesions  Terence Lux chaperoned for the exam.  A:  Well Woman with normal exam  Vaginal atrophy  OAB  P:   Labs with cardiology  Restart estrace cream  Restart ditropan  Mammogram UTD  Colonoscopy due, she will schedule  Discussed breast self exam  Discussed calcium and vit D intake

## 2020-10-02 ENCOUNTER — Other Ambulatory Visit: Payer: Self-pay | Admitting: Cardiology

## 2020-11-03 ENCOUNTER — Other Ambulatory Visit: Payer: Self-pay | Admitting: Cardiology

## 2020-11-06 ENCOUNTER — Ambulatory Visit: Payer: 59 | Admitting: Cardiology

## 2020-11-29 NOTE — Progress Notes (Signed)
Cardiology Office Note:    Date:  11/30/2020   ID:  Angelica Wells, DOB 1960-09-14, MRN [id number]  PCP:  Angelica Noon, MD  Cardiologist:  Angelica Dresser, MD  Referring MD: Angelica Noon, MD   CC: follow up  History of Present Illness:    Angelica Wells is a 60 y.o. female with a hx of chest pain and palpitations seen for follow up today. I initially met her 08/12/2019 as new consult at the request of Angelica Noon, MD for the evaluation and management of chest pain and palpitations.  Today: Overall she is feeling fine. She continues to have chest pains in her central chest, which radiates to her jaw, shoulder and right arm. Since changing her medications at her last visit, she had no episodes for 6 months. However, she began to notice the chest pain again a few months ago. The pain dissipates randomly on its own, she has tried but is unable to find a trigger for symptom relief. She thinks this may be related to drinking tea but she can not pinpoint the cause.   Otherwise she has no new complaints at this time. She remains compliant with her medications.  She denies any shortness of breath, palpitations, or exertional symptoms. No headaches, lightheadedness, or syncope to report. Also has no lower extremity edema, orthopnea or PND.  Her mother has chronic kidney disease stage IV.  Past Medical History:  Diagnosis Date  . Aortic regurgitation   . Eczema   . Eosinophilic esophagitis 40/98  . Esophageal stricture   . Exercise-induced asthma   . GERD (gastroesophageal reflux disease)   . History of uterine fibroid    ut10x7x6 cm   . Hypertension   . Migraine without aura 05/2005    Past Surgical History:  Procedure Laterality Date  . ABDOMINOPLASTY  2000  . ABLATION  01/06/2004   HTA  . BREAST REDUCTION SURGERY    . [place]  . MYOMECTOMY  1995  . TONSILLECTOMY    . uterine ablation      Current Medications: Current Outpatient  Medications on File Prior to Visit  Medication Sig  . atenolol (TENORMIN) 50 MG tablet Take 50 mg daily  . cetirizine (ZYRTEC) 10 MG tablet Take 10 mg by mouth as needed.  . clobetasol cream (TEMOVATE) 0.05 % Apply topically 2 (two) times daily as needed.  Marland Kitchen estradiol (ESTRACE VAGINAL) 0.1 MG/GM vaginal cream USE 1 GRAM VAGINALLY 2 TIMES A WEEK AT BEDTIME  . oxybutynin (DITROPAN-XL) 5 MG 24 hr tablet Take 1 tablet (5 mg total) by mouth at bedtime.  . pantoprazole (PROTONIX) 40 MG tablet TAKE 1 TABLET DAILY  . zolmitriptan (ZOMIG) 5 MG tablet Take 1 tablet (5 mg total) by mouth as needed for migraine.   No current facility-administered medications on file prior to visit.     Allergies:   Sulfonamide derivatives   Social History   Tobacco Use  . Smoking status: Never Smoker  . Smokeless tobacco: Never Used  Vaping Use  . Vaping Use: Never used  Substance Use Topics  . Alcohol use: Yes    Alcohol/week: 2.0 standard drinks    Types: 2 Standard drinks or equivalent per week  . Drug use: No    Family History: family history includes Bladder Cancer in her father; Breast cancer (age of onset: 80) in her maternal grandmother; Hypertension in her father and mother; Migraines in her mother; Other in her mother.  ROS:   Please see the history of present illness.   (+) Central chest pain, radiates to jaw, shoulders, and right arm Additional pertinent ROS otherwise unremarkable.   EKGs/Labs/Other Studies Reviewed:    The following studies were reviewed today:  Echo report 07/19/2010: - Left ventricle: The cavity size was normal. Wall thickness was  increased in a pattern of mild LVH. Systolic function was normal.  The estimated ejection fraction was in the range of 60% to 65%.  Wall motion was normal; there were no regional wall motion  abnormalities.  - Aortic valve: Mild regurgitation.  - Mitral valve: Mild regurgitation.  Transthoracic echocardiography. M-mode, complete  2D, spectral  Doppler, and color Doppler. Height: Height: 160cm. Height: 63in.  Weight: Weight: 84.4kg. Weight: 185.6lb. Body mass index: BMI:  32.9kg/m^2. Body surface area: BSA: 1.71m2. Blood pressure: 130/80.  Patient status: Outpatient. [facility]   EKG:  EKG is personally reviewed.  11/30/2020: NSR at 68 bpm, no R wave V1-V2 08/12/2019: NSR, no R wave V1-V2.  Recent Labs: 11/30/2020: BUN 17; Creatinine, Ser 0.62; Potassium 4.5; Sodium 137  Recent Lipid Panel    Component Value Date/Time   CHOL 204 (H) 11/30/2020 0000   TRIG 121 11/30/2020 0000   HDL 46 11/30/2020 0000   CHOLHDL 4.4 11/30/2020 0000   CHOLHDL 4 07/26/2011 0956   VLDL 16.4 07/26/2011 0956   LDLCALC 136 (H) 11/30/2020 0000    Physical Exam:    VS:  BP 110/70 (BP Location: Left Arm, Patient Position: Sitting, Cuff Size: Normal)   Pulse 68   Ht 5' 1.5" (1.562 m)   Wt 180 lb (81.6 kg)   BMI 33.46 kg/m     Wt Readings from Last 3 Encounters:  11/30/20 180 lb (81.6 kg)  06/21/20 182 lb (82.6 kg)  11/10/19 189 lb (85.7 kg)    GEN: Well nourished, well developed in no acute distress HEENT: Normal, moist mucous membranes NECK: No JVD CARDIAC: regular rhythm, normal S1 and S2, no rubs or gallops. No murmur. VASCULAR: Radial and DP pulses 2+ bilaterally. No carotid bruits RESPIRATORY:  Clear to auscultation without rales, wheezing or rhonchi  ABDOMEN: Soft, non-tender, non-distended MUSCULOSKELETAL:  Ambulates independently SKIN: Warm and dry, no edema NEUROLOGIC:  Alert and oriented x 3. No focal neuro deficits noted. PSYCHIATRIC:  Normal affect   ASSESSMENT:    1. Chest pain of uncertain etiology   2. Essential hypertension   3. Medication management   4. Cardiac risk counseling   5. Counseling on health promotion and disease prevention    PLAN:    Chest pain, atypical: right sided, atypical. Resolved for a time, now recurred -previously discussed treadmill stress with CTA if  treadmill abnormal. Discussed again today. After shared decision making, wishes to defer unless symptoms worsen. -counseled on red flag warning signs that need immediate medical attention  Hypertension: longstanding, since her 89s At goal today. -attempted long taper of atenolol, but BP rose. Doing well on 50 mg daily dose. Consider decreasing again in the future given prior fatigue -on lisinopril 20 mg and HCTZ 25 mg daily, continue. Check BMET today for med management  Cardiac risk counseling and prevention recommendations: especially given family history of heart diseae -recommend heart healthy/Mediterranean diet, with whole grains, fruits, vegetable, fish, lean meats, nuts, and olive oil. Limit salt. -recommend moderate walking, 3-5 times/week for 30-50 minutes each session. Aim for at least 150 minutes.week. Goal should be pace of 3 miles/hours, or walking 1.5  miles in 30 minutes -recommend avoidance of tobacco products. Avoid excess alcohol. -Additional risk factor control:  -Diabetes risk: A1c 5.3 08/12/19  -Lipids: 08/12/19 lipids reviewed above, recheck today  -Blood pressure control: as above  -Weight: BMI 33, working on lifestyle  Plan for follow up: 1 year or sooner as needed  Angelica Dresser, MD, PhD [place]  [facility]    Medication Adjustments/Labs and Tests Ordered: Current medicines are reviewed at length with the patient today.  Concerns regarding medicines are outlined above.  Orders Placed This Encounter  Procedures  . Basic metabolic panel  . Lipid panel  . EKG 12-Lead   Meds ordered this encounter  Medications  . hydrochlorothiazide (HYDRODIURIL) 25 MG tablet    Sig: Take 1 tablet (25 mg total) by mouth daily.    Dispense:  90 tablet    Refill:  3  . lisinopril (ZESTRIL) 20 MG tablet    Sig: Take 1 tablet (20 mg total) by mouth daily.    Dispense:  90 tablet    Refill:  3    Patient Instructions  Medication Instructions:  Your Physician  recommend you continue on your current medication as directed.    *If you need a refill on your cardiac medications before your next appointment, please call your pharmacy*   Lab Work: Your physician recommends lab work today ( Lipid, BMP).  If you have labs (blood work) drawn today and your tests are completely normal, you will receive your results only by: Marland Kitchen MyChart Message (if you have MyChart) OR . A paper copy in the mail If you have any lab test that is abnormal or we need to change your treatment, we will call you to review the results.   Testing/Procedures: None   Follow-Up: At [facility], you and your health needs are our priority.  As part of our continuing mission to provide you with exceptional heart care, we have created designated Provider Care Teams.  These Care Teams include your primary Cardiologist (physician) and Advanced Practice Providers (APPs -  Physician Assistants and Nurse Practitioners) who all work together to provide you with the care you need, when you need it.  We recommend signing up for the patient portal called "MyChart".  Sign up information is provided on this After Visit Summary.  MyChart is used to connect with patients for Virtual Visits (Telemedicine).  Patients are able to view lab/test results, encounter notes, upcoming appointments, etc.  Non-urgent messages can be sent to your provider as well.   To learn more about what you can do with MyChart, go to [email or web address].    Your next appointment:   1 year(s) @ [address] [place], [place] [address]   The format for your next appointment:   In Person  Provider:   Buford Dresser, MD       [facility] [place],acting as a scribe for Angelica Dresser, MD.,have documented all relevant documentation on the behalf of Angelica Dresser, MD,as directed by  Angelica Dresser, MD while in the presence of Angelica Dresser, MD.  I, Angelica Dresser,  MD, have reviewed all documentation for this visit. The documentation on 11/30/20 for the exam, diagnosis, procedures, and orders are all accurate and complete.  Signed, Angelica Dresser, MD PhD 11/30/2020 6:28 PM    [place]

## 2020-11-30 ENCOUNTER — Ambulatory Visit (INDEPENDENT_AMBULATORY_CARE_PROVIDER_SITE_OTHER): Payer: 59 | Admitting: Cardiology

## 2020-11-30 ENCOUNTER — Other Ambulatory Visit: Payer: Self-pay

## 2020-11-30 ENCOUNTER — Encounter: Payer: Self-pay | Admitting: Cardiology

## 2020-11-30 VITALS — BP 110/70 | HR 68 | Ht 61.5 in | Wt 180.0 lb

## 2020-11-30 DIAGNOSIS — I10 Essential (primary) hypertension: Secondary | ICD-10-CM

## 2020-11-30 DIAGNOSIS — Z79899 Other long term (current) drug therapy: Secondary | ICD-10-CM

## 2020-11-30 DIAGNOSIS — Z7189 Other specified counseling: Secondary | ICD-10-CM | POA: Diagnosis not present

## 2020-11-30 DIAGNOSIS — R079 Chest pain, unspecified: Secondary | ICD-10-CM

## 2020-11-30 LAB — BASIC METABOLIC PANEL
BUN/Creatinine Ratio: 27 — ABNORMAL HIGH (ref 9–23)
BUN: 17 mg/dL (ref 6–24)
CO2: 25 mmol/L (ref 20–29)
Calcium: 10 mg/dL (ref 8.7–10.2)
Chloride: 100 mmol/L (ref 96–106)
Creatinine, Ser: 0.62 mg/dL (ref 0.57–1.00)
Glucose: 86 mg/dL (ref 65–99)
Potassium: 4.5 mmol/L (ref 3.5–5.2)
Sodium: 137 mmol/L (ref 134–144)
eGFR: 103 mL/min/{1.73_m2} (ref >59)

## 2020-11-30 LAB — LIPID PANEL
Chol/HDL Ratio: 4.4 ratio (ref 0.0–4.4)
Cholesterol, Total: 204 mg/dL — ABNORMAL HIGH (ref 100–199)
HDL: 46 mg/dL (ref >39)
LDL Chol Calc (NIH): 136 mg/dL — ABNORMAL HIGH (ref 0–99)
Triglycerides: 121 mg/dL (ref 0–149)
VLDL Cholesterol Cal: 22 mg/dL (ref 5–40)

## 2020-11-30 MED ORDER — LISINOPRIL 20 MG PO TABS: 1.0000 | ORAL_TABLET | Freq: Every day | ORAL | 3 refills | Status: DC

## 2020-11-30 MED ORDER — HYDROCHLOROTHIAZIDE 25 MG PO TABS: 1.0000 | ORAL_TABLET | Freq: Every day | ORAL | 3 refills | Status: DC

## 2020-11-30 NOTE — Patient Instructions (Signed)
Medication Instructions:  Your Physician recommend you continue on your current medication as directed.    *If you need a refill on your cardiac medications before your next appointment, please call your pharmacy*   Lab Work: Your physician recommends lab work today ( Lipid, BMP).  If you have labs (blood work) drawn today and your tests are completely normal, you will receive your results only by: Marland Kitchen MyChart Message (if you have MyChart) OR . A paper copy in the mail If you have any lab test that is abnormal or we need to change your treatment, we will call you to review the results.   Testing/Procedures: None   Follow-Up: At [facility], you and your health needs are our priority.  As part of our continuing mission to provide you with exceptional heart care, we have created designated Provider Care Teams.  These Care Teams include your primary Cardiologist (physician) and Advanced Practice Providers (APPs -  Physician Assistants and Nurse Practitioners) who all work together to provide you with the care you need, when you need it.  We recommend signing up for the patient portal called "MyChart".  Sign up information is provided on this After Visit Summary.  MyChart is used to connect with patients for Virtual Visits (Telemedicine).  Patients are able to view lab/test results, encounter notes, upcoming appointments, etc.  Non-urgent messages can be sent to your provider as well.   To learn more about what you can do with MyChart, go to [email or web address].    Your next appointment:   1 year(s) @ [address] [place], [place] [address]   The format for your next appointment:   In Person  Provider:   Buford Dresser, MD

## 2021-02-09 ENCOUNTER — Other Ambulatory Visit: Payer: Self-pay | Admitting: Obstetrics and Gynecology

## 2021-02-09 NOTE — Telephone Encounter (Signed)
The patient was given 42.5 grams with one refill. This should last 42 weeks. She shouldn't be out yet. Can you please check if she is really out? Make sure she is only using 1 gram 2 x a week. I have sent one refill.

## 2021-02-09 NOTE — Telephone Encounter (Signed)
Annual exam due in Dec. 2022

## 2021-02-12 NOTE — Telephone Encounter (Signed)
Spoke with patient she only picked up 1 tube, not the additional refill when Rx was prescribed. She is almost out of current tube, and when she put in for the refill they send the office a refill request when she never picked up the additional refill. Patient said she was confused about why they reached out to the office as well. She is only using 1 gram twice weekly.

## 2021-03-08 ENCOUNTER — Encounter (HOSPITAL_BASED_OUTPATIENT_CLINIC_OR_DEPARTMENT_OTHER): Payer: Self-pay

## 2021-05-31 LAB — HM COLONOSCOPY

## 2021-07-05 ENCOUNTER — Other Ambulatory Visit: Payer: Self-pay | Admitting: Obstetrics and Gynecology

## 2021-10-10 ENCOUNTER — Other Ambulatory Visit: Payer: Self-pay

## 2021-10-23 ENCOUNTER — Other Ambulatory Visit: Payer: Self-pay

## 2021-10-25 ENCOUNTER — Other Ambulatory Visit: Payer: Self-pay

## 2021-11-19 ENCOUNTER — Other Ambulatory Visit: Payer: Self-pay | Admitting: Cardiology

## 2021-11-19 DIAGNOSIS — I10 Essential (primary) hypertension: Secondary | ICD-10-CM

## 2021-11-19 NOTE — Telephone Encounter (Signed)
Rx(s) sent to pharmacy electronically.  

## 2022-01-04 ENCOUNTER — Ambulatory Visit (INDEPENDENT_AMBULATORY_CARE_PROVIDER_SITE_OTHER): Payer: 59 | Admitting: Cardiology

## 2022-01-04 ENCOUNTER — Encounter (HOSPITAL_BASED_OUTPATIENT_CLINIC_OR_DEPARTMENT_OTHER): Payer: Self-pay | Admitting: Cardiology

## 2022-01-04 VITALS — BP 120/72 | HR 66 | Ht 61.5 in | Wt 179.0 lb

## 2022-01-04 DIAGNOSIS — Z7189 Other specified counseling: Secondary | ICD-10-CM | POA: Diagnosis not present

## 2022-01-04 DIAGNOSIS — I10 Essential (primary) hypertension: Secondary | ICD-10-CM

## 2022-01-04 DIAGNOSIS — R0789 Other chest pain: Secondary | ICD-10-CM | POA: Diagnosis not present

## 2022-01-04 NOTE — Progress Notes (Signed)
Cardiology Office Note:    Date:  01/04/2022   ID:  Angelica Wells, DOB Mar 03, 1961, MRN [id number]  PCP:  Chesley Noon, MD  Cardiologist:  Buford Dresser, MD  Referring MD: Chesley Noon, MD   CC: follow up  History of Present Illness:    Angelica Wells is a 61 y.o. female with a hx of chest pain and palpitations seen for follow up today. I initially met her 08/12/2019 as new consult at the request of Chesley Noon, MD for the evaluation and management of chest pain and palpitations.  At her last appointment, she complained of ongoing central chest pains with radiation to her jaw, shoulder, and right arm. This had recurred a few months prior. After med changes at a previous visit, she had no episodes of chest pain for 6 months. She thought her pain may be triggered by drinking tea, but she was unable to confirm this. No known alleviating factors; her pain dissipated randomly on its own. She deferred treadmill stress testing with CTA unless her symptoms worsened.  Today, she states it has been a rough year. She has been assisting her mother at home, which is stressful. On some days, her anxiety "is just crippling." We discussed options regarding medicinal treatment of her anxiety.  Once in a while she will still experience her chest pain, but overall this is significantly improved. She is taking anti-spasmic medication 1-2 times every 6 months. She is no longer taking pantoprazole.  Lately she has been unable to exercise much due to her busy schedule. She does plan to join the [employer].  She denies any palpitations, shortness of breath, or peripheral edema. No lightheadedness, headaches, syncope, orthopnea, or PND.  Her mother had multiple cardiovascular surgeries, including a valve replacement.  Past Medical History:  Diagnosis Date   Aortic regurgitation    Eczema    Eosinophilic esophagitis 33/29   Esophageal stricture    Exercise-induced asthma    GERD  (gastroesophageal reflux disease)    History of uterine fibroid    ut10x7x6 cm    Hypertension    Migraine without aura 05/2005    Past Surgical History:  Procedure Laterality Date   ABDOMINOPLASTY  2000   ABLATION  01/06/2004   HTA   BREAST REDUCTION [place]     uterine ablation      Current Medications: Current Outpatient Medications on File Prior to Visit  Medication Sig   atenolol (TENORMIN) 50 MG tablet Take 50 mg daily   cetirizine (ZYRTEC) 10 MG tablet Take 10 mg by mouth as needed.   clobetasol cream (TEMOVATE) 0.05 % Apply topically 2 (two) times daily as needed.   estradiol (ESTRACE) 0.1 MG/GM vaginal cream USE 1 GRAM VAGINALLY 2 TIMES A WEEK AT BEDTIME   hydrochlorothiazide (HYDRODIURIL) 25 MG tablet Take 1 tablet (25 mg total) by mouth daily.   lisinopril (ZESTRIL) 20 MG tablet Take 1 tablet (20 mg total) by mouth daily. Need appointment   oxybutynin (DITROPAN-XL) 5 MG 24 hr tablet Take 1 tablet (5 mg total) by mouth at bedtime.   pantoprazole (PROTONIX) 40 MG tablet TAKE 1 TABLET DAILY   zolmitriptan (ZOMIG) 5 MG tablet Take 1 tablet (5 mg total) by mouth as needed for migraine.   No current facility-administered medications on file prior to visit.     Allergies:   Sulfonamide derivatives  Social History   Tobacco Use   Smoking status: Never   Smokeless tobacco: Never  Vaping Use   Vaping Use: Never used  Substance Use Topics   Alcohol use: Yes    Alcohol/week: 2.0 standard drinks of alcohol    Types: 2 Standard drinks or equivalent per week   Drug use: No    Family History: family history includes Bladder Cancer in her father; Breast cancer (age of onset: 72) in her maternal grandmother; Hypertension in her father and mother; Migraines in her mother; Other in her mother. Her mother has chronic kidney disease stage IV.  ROS:   Please see the history of present illness.   (+)  Stress/Anxiety (+) Rare chest pain Additional pertinent ROS otherwise unremarkable.   EKGs/Labs/Other Studies Reviewed:    The following studies were reviewed today:  US Abdomen  03/14/2021  [facility]): FINDINGS:   PANCREAS:  No focal abnormalities are identified.  Visualization is limited.   VASCULATURE:  No abdominal aortic aneurysm.  Visualized IVC is patent.  Portal vein is patent with normal flow direction.   HEPATOBILIARY:  Liver: Normal in size measuring 16.4 cm in length. Echotexture is mildly, diffusely increased. No focal lesions.    Gallbladder: There are no gallstones, gallbladder wall thickening, or pericholecystic fluid. Negative sonographic Percell Miller sign reported.  CBD: Normal.  No intrahepatic biliary ductal dilatation.   KIDNEYS:  Both kidneys are normal in size.  No hydronephrosis.  No suspicious masses.  Normal renal echotexture.   SPLEEN:  Size is within normal limits.  No focal abnormality.   IMPRESSION:  Mild, diffuse hepatic steatosis.   Echo report 07/19/2010: - Left ventricle: The cavity size was normal. Wall thickness was    increased in a pattern of mild LVH. Systolic function was normal.    The estimated ejection fraction was in the range of 60% to 65%.    Wall motion was normal; there were no regional wall motion    abnormalities.  - Aortic valve: Mild regurgitation.  - Mitral valve: Mild regurgitation.  Transthoracic echocardiography. M-mode, complete 2D, spectral  Doppler, and color Doppler. Height: Height: 160cm. Height: 63in.  Weight: Weight: 84.4kg. Weight: 185.6lb. Body mass index: BMI:  32.9kg/m^2. Body surface area: BSA: 1.43m2. Blood pressure: 130/80.  Patient status: Outpatient. [facility]   EKG:  EKG is personally reviewed.  01/04/2022:  NSR at 66 bpm, R wave pattern unchanged 11/30/2020: NSR at 68 bpm, no R wave V1-V2 08/12/2019: NSR, no R wave V1-V2.  Recent Labs: No results found for requested labs  within last 365 days.   Recent Lipid Panel    Component Value Date/Time   CHOL 204 (H) 11/30/2020 0000   TRIG 121 11/30/2020 0000   HDL 46 11/30/2020 0000   CHOLHDL 4.4 11/30/2020 0000   CHOLHDL 4 07/26/2011 0956   VLDL 16.4 07/26/2011 0956   LDLCALC 136 (H) 11/30/2020 0000    Physical Exam:    VS:  BP 120/72   Pulse 66   Ht 5' 1.5" (1.562 m)   Wt 179 lb (81.2 kg)   BMI 33.27 kg/m     Wt Readings from Last 3 Encounters:  01/04/22 179 lb (81.2 kg)  11/30/20 180 lb (81.6 kg)  06/21/20 182 lb (82.6 kg)    GEN: Well nourished, well developed in no acute distress HEENT: Normal, moist mucous membranes NECK: No JVD CARDIAC: regular rhythm, normal S1 and S2, no rubs or gallops. No murmur. VASCULAR: Radial and  DP pulses 2+ bilaterally. No carotid bruits RESPIRATORY:  Clear to auscultation without rales, wheezing or rhonchi  ABDOMEN: Soft, non-tender, non-distended MUSCULOSKELETAL:  Ambulates independently SKIN: Warm and dry, no edema NEUROLOGIC:  Alert and oriented x 3. No focal neuro deficits noted. PSYCHIATRIC:  Normal affect    ASSESSMENT:    1. Essential hypertension   2. Cardiac risk counseling   3. Counseling on health promotion and disease prevention   4. Atypical chest pain     PLAN:    Chest pain, atypical: right sided, atypical. -counseled on red flag warning signs that need immediate medical attention  Hypertension: longstanding, since her 52s. At goal today. -attempted long taper of atenolol, but BP rose. Doing well on 50 mg daily dose. Consider decreasing again in the future given prior fatigue -on lisinopril 20 mg and HCTZ 25 mg daily, continue.   Cardiac risk counseling and prevention recommendations: especially given family history of heart diseae -recommend heart healthy/Mediterranean diet, with whole grains, fruits, vegetable, fish, lean meats, nuts, and olive oil. Limit salt. -recommend moderate walking, 3-5 times/week for 30-50 minutes each  session. Aim for at least 150 minutes.week. Goal should be pace of 3 miles/hours, or walking 1.5 miles in 30 minutes -recommend avoidance of tobacco products. Avoid excess alcohol.  Plan for follow up: 1 year or sooner as needed  Buford Dresser, MD, PhD [place]  [facility]    Medication Adjustments/Labs and Tests Ordered: Current medicines are reviewed at length with the patient today.  Concerns regarding medicines are outlined above.   No orders of the defined types were placed in this encounter.  No orders of the defined types were placed in this encounter.  There are no Patient Instructions on file for this visit.   I,Mathew Stumpf,acting as a Education administrator for [employer], MD.,have documented all relevant documentation on the behalf of Buford Dresser, MD,as directed by  Buford Dresser, MD while in the presence of Buford Dresser, MD.  I, Madelin Rear, have reviewed all documentation for this visit. The documentation on 01/04/22 for the exam, diagnosis, procedures, and orders are all accurate and complete.  Signed, Buford Dresser, MD PhD 01/04/2022 7:20 AM    [place]

## 2022-01-07 ENCOUNTER — Telehealth: Payer: Self-pay | Admitting: Cardiology

## 2022-01-07 ENCOUNTER — Other Ambulatory Visit: Payer: Self-pay | Admitting: Cardiology

## 2022-01-07 DIAGNOSIS — I10 Essential (primary) hypertension: Secondary | ICD-10-CM

## 2022-01-08 MED ORDER — ATENOLOL 50 MG PO TABS: ORAL_TABLET | ORAL | 3 refills | Status: DC

## 2022-01-21 ENCOUNTER — Encounter (HOSPITAL_BASED_OUTPATIENT_CLINIC_OR_DEPARTMENT_OTHER): Payer: Self-pay | Admitting: Cardiology

## 2022-02-18 ENCOUNTER — Encounter: Payer: Self-pay | Admitting: Obstetrics and Gynecology

## 2022-02-18 ENCOUNTER — Other Ambulatory Visit: Payer: Self-pay | Admitting: Cardiology

## 2022-02-18 DIAGNOSIS — I10 Essential (primary) hypertension: Secondary | ICD-10-CM

## 2022-02-18 NOTE — Telephone Encounter (Signed)
Rx(s) sent to pharmacy electronically.  

## 2022-08-26 NOTE — Progress Notes (Signed)
62 y.o. G101P2002 Married White or Caucasian Not Hispanic or Latino female here for annual exam.  No vaginal bleeding. H/O vaginal atrophy, on estrogen. She    H/O multiple small fibroids. H/O endometrial ablation.    H/O OAB, she stopped the medication for concerns about possible side effects. It was helpful and wants to restart the ditropan.  She is taking care of her Mom who is independent living, but has early dementia.   No LMP recorded. Patient has had an ablation.          Sexually active: No.  The current method of family planning is post menopausal status.    Exercising: Yes.    The patient does not participate in regular exercise at present. Smoker:  no  Health Maintenance: Pap:   10/20/17 WNL Neg HR HPV  10-20-13 WNL NEG HR HPV   History of abnormal Pap:  no MMG:  02/14/22 Bi-rads 1 neg  BMD:   none  Colonoscopy: 2022 Normal per patient, f/u in 5 years  TDaP:  03/15/15 Gardasil: NA   reports that she has never smoked. She has never used smokeless tobacco. She reports current alcohol use of about 2.0 standard drinks of alcohol per week. She reports that she does not use drugs. 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 2 grown kids.   Past Medical History:  Diagnosis Date   Aortic regurgitation    Eczema    Eosinophilic esophagitis [id number]   Esophageal stricture    Exercise-induced asthma    GERD (gastroesophageal reflux disease)    History of uterine fibroid    ut10x7x6 cm    Hypertension    Migraine without aura 05/2005    Past Surgical History:  Procedure Laterality Date   ABDOMINOPLASTY  2000   ABLATION  01/06/2004   HTA   BREAST REDUCTION SURGERY     CESAREAN SECTION  1996, [place]     uterine ablation      Current Outpatient Medications  Medication Sig Dispense Refill   atenolol (TENORMIN) 50 MG tablet Take 50 mg daily 90 tablet 3   cetirizine (ZYRTEC) 10 MG tablet Take 10 mg by mouth as needed.     clobetasol cream  (TEMOVATE) 0.05 % Apply topically 2 (two) times daily as needed. 45 g 1   estradiol (ESTRACE) 0.1 MG/GM vaginal cream USE 1 GRAM VAGINALLY 2 TIMES A WEEK AT BEDTIME 42.5 g 0   hydrochlorothiazide (HYDRODIURIL) 25 MG tablet TAKE 1 TABLET DAILY 90 tablet 3   lisinopril (ZESTRIL) 20 MG tablet TAKE 1 TABLET DAILY (NEED APPOINTMENT) 90 tablet 2   omeprazole (PRILOSEC) 40 MG capsule Take 40 mg by mouth daily.     pantoprazole (PROTONIX) 40 MG tablet TAKE 1 TABLET DAILY (Patient not taking: Reported on 01/04/2022) 90 tablet 2   No current facility-administered medications for this visit.    Family History  Problem Relation Age of Onset   Hypertension Mother    Other Mother        mvp, bicuspid aortic valve cabg, valve replacement   Migraines Mother        went away after menopause   Bladder Cancer Father    Hypertension Father    Breast cancer Maternal Grandmother 67    Review of Systems  All other systems reviewed and are negative.   Exam:   There were no vitals taken for this visit.  Weight change: @WEIGHTCHANGE$ @ Height:  Ht Readings from Last 3 Encounters:  01/04/22 5' 1.5" (1.562 m)  11/30/20 5' 1.5" (1.562 m)  06/21/20 5' 1.5" (1.562 m)    General appearance: alert, cooperative and appears stated age Head: Normocephalic, without obvious abnormality, atraumatic Neck: no adenopathy, supple, symmetrical, trachea midline and thyroid normal to inspection and palpation Lungs: clear to auscultation bilaterally Cardiovascular: regular rate and rhythm Breasts: normal appearance, no masses or tenderness Abdomen: soft, non-tender; non distended,  no masses,  no organomegaly Extremities: extremities normal, atraumatic, no cyanosis or edema Skin: Skin color, texture, turgor normal. No rashes or lesions Lymph nodes: Cervical, supraclavicular, and axillary nodes normal. No abnormal inguinal nodes palpated Neurologic: Grossly normal   Pelvic: External genitalia:  no lesions               Urethra:  normal appearing urethra with no masses, tenderness or lesions              Bartholins and Skenes: normal                 Vagina: normal appearing vagina with normal color and discharge, no lesions              Cervix: no lesions               Bimanual Exam:  Uterus:  normal size, contour, position, consistency, mobility, non-tender              Adnexa: no mass, fullness, tenderness               Rectovaginal: Confirms               Anus:  normal sphincter tone, no lesions  Gae Dry, CMA chaperoned for the exam.  1. Well woman exam Discussed breast self exam Discussed calcium and vit D intake Mammogram and colonoscopy UTD  2. Screening for cervical cancer - Cytology - PAP  3. OVERACTIVE BLADDER Will restart medication  - oxybutynin (DITROPAN XL) 5 MG 24 hr tablet; Take 1 tablet (5 mg total) by mouth at bedtime.  Dispense: 90 tablet; Refill: 3  4. Vaginal atrophy Helped with estrogen, wants to continue.  - estradiol (ESTRACE) 0.1 MG/GM vaginal cream; USE 1 GRAM VAGINALLY 2 TIMES A WEEK AT BEDTIME  Dispense: 42.5 g; Refill: 2

## 2022-09-03 ENCOUNTER — Ambulatory Visit (INDEPENDENT_AMBULATORY_CARE_PROVIDER_SITE_OTHER): Payer: 59 | Admitting: Obstetrics and Gynecology

## 2022-09-03 ENCOUNTER — Other Ambulatory Visit (HOSPITAL_COMMUNITY)
Admission: RE | Admit: 2022-09-03 | Discharge: 2022-09-03 | Disposition: A | Discharge status: Home / Self Care | Payer: 59 | Source: Ambulatory Visit | Attending: Obstetrics and Gynecology | Admitting: Obstetrics and Gynecology

## 2022-09-03 ENCOUNTER — Encounter: Payer: Self-pay | Admitting: Obstetrics and Gynecology

## 2022-09-03 VITALS — BP 128/76 | HR 88 | Ht 62.0 in | Wt 185.0 lb

## 2022-09-03 DIAGNOSIS — Z124 Encounter for screening for malignant neoplasm of cervix: Secondary | ICD-10-CM

## 2022-09-03 DIAGNOSIS — Z01419 Encounter for gynecological examination (general) (routine) without abnormal findings: Secondary | ICD-10-CM

## 2022-09-03 DIAGNOSIS — N318 Other neuromuscular dysfunction of bladder: Secondary | ICD-10-CM | POA: Diagnosis not present

## 2022-09-03 DIAGNOSIS — N952 Postmenopausal atrophic vaginitis: Secondary | ICD-10-CM

## 2022-09-03 MED ORDER — OXYBUTYNIN CHLORIDE ER 5 MG PO TB24: 5.0000 mg | ORAL_TABLET | Freq: Every day | ORAL | 3 refills | Status: DC

## 2022-09-03 MED ORDER — ESTRADIOL 0.1 MG/GM VA CREA
TOPICAL_CREAM | VAGINAL | 2 refills | Status: DC
Start: 2022-09-03 — End: 2023-12-18

## 2022-09-03 NOTE — Patient Instructions (Signed)

## 2022-09-04 LAB — CYTOLOGY - PAP
Adequacy: ABSENT
Comment: NEGATIVE
Diagnosis: NEGATIVE
High risk HPV: NEGATIVE

## 2022-11-14 ENCOUNTER — Other Ambulatory Visit: Payer: Self-pay | Admitting: Cardiology

## 2022-11-14 DIAGNOSIS — I10 Essential (primary) hypertension: Secondary | ICD-10-CM

## 2022-11-14 NOTE — Telephone Encounter (Signed)
Rx(s) sent to pharmacy electronically.  

## 2022-11-27 ENCOUNTER — Other Ambulatory Visit: Payer: Self-pay | Admitting: Cardiology

## 2022-11-27 DIAGNOSIS — I10 Essential (primary) hypertension: Secondary | ICD-10-CM

## 2022-11-27 NOTE — Telephone Encounter (Signed)
Rx request sent to pharmacy.  

## 2022-12-30 ENCOUNTER — Encounter (HOSPITAL_BASED_OUTPATIENT_CLINIC_OR_DEPARTMENT_OTHER): Payer: Self-pay

## 2023-01-06 ENCOUNTER — Encounter (HOSPITAL_BASED_OUTPATIENT_CLINIC_OR_DEPARTMENT_OTHER): Payer: Self-pay | Admitting: Cardiology

## 2023-01-06 ENCOUNTER — Ambulatory Visit (INDEPENDENT_AMBULATORY_CARE_PROVIDER_SITE_OTHER): Payer: BC Managed Care – PPO | Admitting: Cardiology

## 2023-01-06 VITALS — BP 104/70 | HR 63 | Ht 62.0 in | Wt 185.1 lb

## 2023-01-06 DIAGNOSIS — R6883 Chills (without fever): Secondary | ICD-10-CM | POA: Diagnosis not present

## 2023-01-06 DIAGNOSIS — Z7189 Other specified counseling: Secondary | ICD-10-CM

## 2023-01-06 DIAGNOSIS — I10 Essential (primary) hypertension: Secondary | ICD-10-CM | POA: Diagnosis not present

## 2023-01-06 DIAGNOSIS — R112 Nausea with vomiting, unspecified: Secondary | ICD-10-CM

## 2023-01-06 DIAGNOSIS — Z13220 Encounter for screening for lipoid disorders: Secondary | ICD-10-CM

## 2023-01-06 NOTE — Patient Instructions (Signed)
Medication Instructions:  Your physician recommends that you continue on your current medications as directed. Please refer to the Current Medication list given to you today.  *If you need a refill on your cardiac medications before your next appointment, please call your pharmacy*  Lab Work: LP/BMET/CBC/TSH TODAY   If you have labs (blood work) drawn today and your tests are completely normal, you will receive your results only by: MyChart Message (if you have MyChart) OR A paper copy in the mail If you have any lab test that is abnormal or we need to change your treatment, we will call you to review the results.  Testing/Procedures: NONE  Follow-Up: At [facility], you and your health needs are our priority.  As part of our continuing mission to provide you with exceptional heart care, we have created designated Provider Care Teams.  These Care Teams include your primary Cardiologist (physician) and Advanced Practice Providers (APPs -  Physician Assistants and Nurse Practitioners) who all work together to provide you with the care you need, when you need it.  We recommend signing up for the patient portal called "MyChart".  Sign up information is provided on this After Visit Summary.  MyChart is used to connect with patients for Virtual Visits (Telemedicine).  Patients are able to view lab/test results, encounter notes, upcoming appointments, etc.  Non-urgent messages can be sent to your provider as well.   To learn more about what you can do with MyChart, go to [email or web address].    Your next appointment:   12 month(s)  Provider:   Jodelle Red, MD

## 2023-01-06 NOTE — Progress Notes (Signed)
Cardiology Office Note:  .   Date:  01/06/2023  ID:  Angelica Wells, DOB 19-Jul-1960, MRN [id number] PCP: Eartha Inch, MD  [facility] Providers Cardiologist:  Jodelle Red, MD {  History of Present Illness: .   Angelica Wells is a 62 y.o. female with a hx of chest pain and palpitations seen for follow up today. I initially met her 08/12/2019 for chest pain and palpitations.   Today: Not been feeling well the last few weeks. Has had 3-4 occasional with severe chills at night lasting up to an hour. Stomach with nausea, yesterday AM vomited first thing in the morning. No fevers. Minor constipation. Diet has been poor. Under a lot of stress caring for her mom with dementia. Struggling with anxiety. Her PCP is leaving, wants to establish with Dr. Casimiro Needle as that is who her mom sees. Hydrating well, drinks water all day long at work. Does have a history of acid reflux. Sometimes feels a tickle/phlegm in her throat that she can't clear.  ROS: Denies chest pain, shortness of breath at rest or with normal exertion. No PND, orthopnea, LE edema or unexpected weight gain. No syncope or palpitations. ROS otherwise negative except as noted.   Studies Reviewed: Marland Kitchen    EKG:  EKG Interpretation  Date/Time:  Monday January 06 2023 08:41:29 EDT Ventricular Rate:  63 PR Interval:  198 QRS Duration: 78 QT Interval:  386 QTC Calculation: 395 R Axis:   7 Text Interpretation: Normal sinus rhythm  low anteroseptal forces (cited on or before 04-Jan-2004) Confirmed by Jodelle Red [phone]) on 01/06/2023 8:43:37 AM    Physical Exam:   VS:  BP 104/70 (BP Location: Left Arm, Patient Position: Sitting, Cuff Size: Large)   Pulse 63   Ht 5\' 2"  (1.575 m)   Wt 185 lb 1.6 oz (84 kg)   BMI 33.86 kg/m    Wt Readings from Last 3 Encounters:  01/06/23 185 lb 1.6 oz (84 kg)  09/03/22 185 lb (83.9 kg)  01/04/22 179 lb (81.2 kg)    GEN: Well nourished, well developed in no acute  distress HEENT: Normal, moist mucous membranes NECK: No JVD CARDIAC: regular rhythm, normal S1 and S2, no rubs or gallops. No murmur. VASCULAR: Radial and DP pulses 2+ bilaterally. No carotid bruits RESPIRATORY:  Clear to auscultation without rales, wheezing or rhonchi  ABDOMEN: Soft, non-tender, non-distended MUSCULOSKELETAL:  Ambulates independently SKIN: Warm and dry, no edema NEUROLOGIC:  Alert and oriented x 3. No focal neuro deficits noted. PSYCHIATRIC:  Normal affect    ASSESSMENT AND PLAN: .   Chills Nausea Emesis -doesn't have PCP currently, will check basic labs until she establishes with Dr. Casimiro Needle   Hypertension: longstanding, since her 34s. At goal today. -attempted long taper of atenolol, but BP rose. Doing well on 50 mg daily dose. Consider decreasing again in the future given prior fatigue -on lisinopril 20 mg and HCTZ 25 mg daily, continue.   CV risk counseling and prevention -recommend heart healthy/Mediterranean diet, with whole grains, fruits, vegetable, fish, lean meats, nuts, and olive oil. Limit salt. -recommend moderate walking, 3-5 times/week for 30-50 minutes each session. Aim for at least 150 minutes.week. Goal should be pace of 3 miles/hours, or walking 1.5 miles in 30 minutes -recommend avoidance of tobacco products. Avoid excess alcohol. -ASCVD risk score: The 10-year ASCVD risk score (Arnett DK, et al., 2019) is: 3.4%   Values used to calculate the score:     Age: 40 years  Sex: Female     Is Non-Hispanic African American: No     Diabetic: No     Tobacco smoker: No     Systolic Blood Pressure: 104 mmHg     Is BP treated: Yes     HDL Cholesterol: 46 mg/dL     Total Cholesterol: 204 mg/dL   Dispo: 1 year or sooner as needed  Signed, Jodelle Red, MD   Jodelle Red, MD, PhD, [facility]  [facility]  [place]  [facility] at [facility] [address] [place], [place] [address] [phone]

## 2023-01-07 ENCOUNTER — Telehealth (HOSPITAL_BASED_OUTPATIENT_CLINIC_OR_DEPARTMENT_OTHER): Payer: Self-pay

## 2023-01-07 DIAGNOSIS — I10 Essential (primary) hypertension: Secondary | ICD-10-CM

## 2023-01-07 LAB — CBC WITH DIFFERENTIAL/PLATELET
Basophils Absolute: 0.1 10*3/uL (ref 0.0–0.2)
Basos: 1 %
EOS (ABSOLUTE): 0.5 10*3/uL — ABNORMAL HIGH (ref 0.0–0.4)
Eos: 5 %
Hematocrit: 41.5 % (ref 34.0–46.6)
Hemoglobin: 13.1 g/dL (ref 11.1–15.9)
Immature Grans (Abs): 0 10*3/uL (ref 0.0–0.1)
Immature Granulocytes: 0 %
Lymphocytes Absolute: 1.5 10*3/uL (ref 0.7–3.1)
Lymphs: 18 %
MCH: 26.8 pg (ref 26.6–33.0)
MCHC: 31.6 g/dL (ref 31.5–35.7)
MCV: 85 fL (ref 79–97)
Monocytes Absolute: 0.6 10*3/uL (ref 0.1–0.9)
Monocytes: 7 %
Neutrophils Absolute: 5.6 10*3/uL (ref 1.4–7.0)
Neutrophils: 69 %
Platelets: 473 10*3/uL — ABNORMAL HIGH (ref 150–450)
RBC: 4.88 x10E6/uL (ref 3.77–5.28)
RDW: 12.2 % (ref 11.7–15.4)
WBC: 8.3 10*3/uL (ref 3.4–10.8)

## 2023-01-07 LAB — BASIC METABOLIC PANEL
BUN/Creatinine Ratio: 16 (ref 12–28)
BUN: 22 mg/dL (ref 8–27)
CO2: 24 mmol/L (ref 20–29)
Calcium: 10 mg/dL (ref 8.7–10.3)
Chloride: 103 mmol/L (ref 96–106)
Creatinine, Ser: 1.39 mg/dL — ABNORMAL HIGH (ref 0.57–1.00)
Glucose: 92 mg/dL (ref 70–99)
Potassium: 4.3 mmol/L (ref 3.5–5.2)
Sodium: 141 mmol/L (ref 134–144)
eGFR: 43 mL/min/{1.73_m2} — ABNORMAL LOW (ref >59)

## 2023-01-07 LAB — LIPID PANEL
Chol/HDL Ratio: 4.1 ratio (ref 0.0–4.4)
Cholesterol, Total: 151 mg/dL (ref 100–199)
HDL: 37 mg/dL — ABNORMAL LOW (ref >39)
LDL Chol Calc (NIH): 90 mg/dL (ref 0–99)
Triglycerides: 137 mg/dL (ref 0–149)
VLDL Cholesterol Cal: 24 mg/dL (ref 5–40)

## 2023-01-07 LAB — TSH: TSH: 1.71 u[IU]/mL (ref 0.450–4.500)

## 2023-01-07 NOTE — Telephone Encounter (Addendum)
Called results to patient and left results on VM (ok per DPR), instructions left to call office back if patient has any questions!     ----- Message from Jodelle Red, MD sent at 01/07/2023 12:19 PM EDT ----- Labs show cholesterol is improved compared to prior. Does look like she is dehydrated based on the kidney function panel--if she feels like she is drinking a lot of water, then I'd have her stop the hydrochlorothiazide and recheck labs in 2-3 weeks. Thyroid is normal. Blood counts show nothing high risk.

## 2023-01-08 NOTE — Addendum Note (Signed)
Addended by: Marlene Lard on: 01/08/2023 12:19 PM   Modules accepted: Orders

## 2023-01-08 NOTE — Telephone Encounter (Signed)
Patient returned call, transferred from call center,   She was surprised to see that we thought she was dehydrated. She stopped her hydrochlorothiazide today and will come back in 2-3 weeks for repeat labs. Labs ordered

## 2023-01-20 DIAGNOSIS — R140 Abdominal distension (gaseous): Secondary | ICD-10-CM | POA: Diagnosis not present

## 2023-01-20 DIAGNOSIS — R1013 Epigastric pain: Secondary | ICD-10-CM | POA: Diagnosis not present

## 2023-01-20 DIAGNOSIS — R1310 Dysphagia, unspecified: Secondary | ICD-10-CM | POA: Diagnosis not present

## 2023-01-20 DIAGNOSIS — R112 Nausea with vomiting, unspecified: Secondary | ICD-10-CM | POA: Diagnosis not present

## 2023-02-19 ENCOUNTER — Encounter: Payer: Self-pay | Admitting: Family Medicine

## 2023-02-19 ENCOUNTER — Ambulatory Visit (INDEPENDENT_AMBULATORY_CARE_PROVIDER_SITE_OTHER): Payer: BC Managed Care – PPO | Admitting: Family Medicine

## 2023-02-19 VITALS — BP 100/68 | HR 64 | Temp 98.2°F | Ht 61.25 in | Wt 187.8 lb

## 2023-02-19 DIAGNOSIS — I10 Essential (primary) hypertension: Secondary | ICD-10-CM

## 2023-02-19 DIAGNOSIS — F419 Anxiety disorder, unspecified: Secondary | ICD-10-CM

## 2023-02-19 LAB — BASIC METABOLIC PANEL
BUN: 20 mg/dL (ref 6–23)
CO2: 26 mEq/L (ref 19–32)
Calcium: 10.1 mg/dL (ref 8.4–10.5)
Chloride: 102 mEq/L (ref 96–112)
Creatinine, Ser: 1.06 mg/dL (ref 0.40–1.20)
GFR: 56.94 mL/min — ABNORMAL LOW (ref >60.00)
Glucose, Bld: 87 mg/dL (ref 70–99)
Potassium: 4.3 mEq/L (ref 3.5–5.1)
Sodium: 137 mEq/L (ref 135–145)

## 2023-02-19 MED ORDER — BUSPIRONE HCL 7.5 MG PO TABS
7.5000 mg | ORAL_TABLET | Freq: Two times a day (BID) | ORAL | 2 refills | Status: DC | PRN
Start: 2023-02-19 — End: 2023-02-25

## 2023-02-19 NOTE — Progress Notes (Unsigned)
New Patient Office Visit  Subjective    Patient ID: Angelica Wells, female    DOB: 1961-06-09  Age: 62 y.o. MRN: [id number]  CC:  Chief Complaint  Patient presents with   Establish Care    HPI RAELEA SEGNER presents to establish care Patient is reporting increasing anxiety. 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 States that she also works full time, states her mother lives in an independent living facility. States that her mother calls her a lot asking to see her, sometimes has trouble being able to see her every day. States that this gives her a lot of anxiety, when she visits her mother she "never know what she will find". States that her mother is getting progressively worse. States that her anxiety happens 2-3 times per week. Lasts for several hours until she is able to unwind and get ready for bed. States she tries to cope with "self talk" and sort of like gratitude exercises which does help. Is trying to incorporate strategies that help her from getting overwhelmed. Occasionally has trouble with sleeping, about once a week.   Patient has a history of HTN, is on atenolol 50 mg daily and lisinopril 20 mg daily. She is taking medication as prescribed. Is also seeing a cardiologist for   I have reviewed all aspects of the patient's medical history including social, family, and surgical history.   Current Outpatient Medications  Medication Instructions   atenolol (TENORMIN) 50 MG tablet TAKE 1 TABLET DAILY   busPIRone (BUSPAR) 7.5 mg, Oral, 2 times daily PRN   cetirizine (ZYRTEC) 10 mg, Oral, As needed   clobetasol cream (TEMOVATE) 0.05 % Topical, 2 times daily PRN   estradiol (ESTRACE) 0.1 MG/GM vaginal cream USE 1 GRAM VAGINALLY 2 TIMES A WEEK AT BEDTIME   lisinopril (ZESTRIL) 20 MG tablet TAKE 1 TABLET DAILY (NEED APPOINTMENT)   omeprazole (PRILOSEC) 40 mg, Oral, Daily   zolmitriptan (ZOMIG) 5 mg, Oral, As needed    Past Medical  History:  Diagnosis Date   Aortic regurgitation    Eczema    Eosinophilic esophagitis 12/15   Esophageal stricture    Exercise-induced asthma    GERD (gastroesophageal reflux disease)    History of uterine fibroid    [id number] cm    Hypertension    Migraine without aura 05/2005    Past Surgical History:  Procedure Laterality Date   ABDOMINOPLASTY  2000   ABLATION  01/06/2004   HTA   BREAST REDUCTION SURGERY     CESAREAN SECTION  1996, 1998   MYOMECTOMY  1995   TONSILLECTOMY     uterine ablation      Family History  Problem Relation Age of Onset   Hypertension Mother    Other Mother        mvp, bicuspid aortic valve cabg, valve replacement   Migraines Mother        went away after menopause   Bladder Cancer Father    Hypertension Father    Breast cancer Maternal Grandmother 62    Social History   Socioeconomic History   Marital status: Married    Spouse name: Not on file   Number of children: Not on file   Years of education: Not on file   Highest education level: Not on file  Occupational History   Not on file  Tobacco Use   Smoking status: Never   Smokeless tobacco: Never  Vaping Use  Vaping status: Never Used  Substance and Sexual Activity   Alcohol use: Yes    Alcohol/week: 2.0 standard drinks of alcohol    Types: 2 Standard drinks or equivalent per week   Drug use: No   Sexual activity: Not Currently    Partners: Male    Birth control/protection: Post-menopausal  Other Topics Concern   Not on file  Social History Narrative   Married   Regular exercise- no   2 caffeine   HH of 4   No pet   BS degree   Work in [employer]   Social Determinants of Health   Financial Resource Strain: Low Risk  (02/25/2021)   Received from [facility], [facility]   Overall Financial Resource Strain (CARDIA)    Difficulty of Paying Living Expenses: Not hard at all  Food Insecurity: No Food Insecurity (02/25/2021)   Received from  [facility], [facility]   Hunger Vital Sign    Worried About Running Out of Food in the Last Year: Never true    Ran Out of Food in the Last Year: Never true  Transportation Needs: No Transportation Needs (02/25/2021)   Received from [facility], [facility]   PRAPARE - Transportation    Lack of Transportation (Medical): No    Lack of Transportation (Non-Medical): No  Physical Activity: Insufficiently Active (02/25/2021)   Received from [facility], [facility]   Exercise Vital Sign    Days of Exercise per Week: 1 day    Minutes of Exercise per Session: 10 min  Stress: Stress Concern Present (02/25/2021)   Received from [facility], [facility] - Occupational Stress Questionnaire    Feeling of Stress : To some extent  Social Connections: Unknown (11/26/2021)   Received from [facility], [facility]   Social Network    Social Network: Not on file  Intimate Partner Violence: Unknown (10/18/2021)   Received from [facility], [facility]   HITS    Physically Hurt: Not on file    Insult or Talk Down To: Not on file    Threaten Physical Harm: Not on file    Scream or Curse: Not on file    Review of Systems  All other systems reviewed and are negative.       Objective    BP 100/68 (BP Location: Left Arm, Patient Position: Sitting, Cuff Size: Normal)   Pulse 64   Temp 98.2 F (36.8 C) (Oral)   Ht 5' 1.25" (1.556 m)   Wt 187 lb 12.8 oz (85.2 kg)   SpO2 100%   BMI 35.20 kg/m   Physical Exam Vitals reviewed.  Constitutional:      Appearance: Normal appearance. She is well-groomed and normal weight.  Eyes:     Conjunctiva/sclera: Conjunctivae normal.  Neck:     Thyroid: No thyromegaly.  Cardiovascular:     Rate and Rhythm: Normal rate and regular rhythm.     Pulses: Normal pulses.     Heart sounds: S1 normal and S2 normal.  Pulmonary:     Effort: Pulmonary effort is normal.     Breath sounds: Normal  breath sounds and air entry.  Abdominal:     General: Bowel sounds are normal.  Musculoskeletal:     Right lower leg: No edema.     Left lower leg: No edema.  Neurological:     Mental Status: She is alert and oriented to person, place, and time. Mental  status is at baseline.     Gait: Gait is intact.  Psychiatric:        Mood and Affect: Mood and affect normal.        Speech: Speech normal.        Behavior: Behavior normal.        Judgment: Judgment normal.         Assessment & Plan:  Anxiety Assessment & Plan: Secondary to caring for her mother who has progressive dementia. I advised that we can try buspirone 7.5 mg twice daily as needed for anxiety, she reports she does not want to take anything daily, I advised that we could try it as needed to see if it would work.   Orders: -     busPIRone HCl; Take 1 tablet (7.5 mg total) by mouth 2 (two) times daily as needed.  Dispense: 60 tablet; Refill: 2  Primary hypertension -     Basic metabolic panel  Essential hypertension Assessment & Plan: Current hypertension medications:       Sig   atenolol (TENORMIN) 50 MG tablet (Taking) TAKE 1 TABLET DAILY   lisinopril (ZESTRIL) 20 MG tablet (Taking) TAKE 1 TABLET DAILY (NEED APPOINTMENT)     BP is well controlled today on the above medications, will continue as prescribed.       Return in about 4 months (around 06/19/2023) for annual physical exam anytime before december 6.   Karie Georges, MD

## 2023-02-20 DIAGNOSIS — F419 Anxiety disorder, unspecified: Secondary | ICD-10-CM | POA: Insufficient documentation

## 2023-02-20 NOTE — Assessment & Plan Note (Signed)
Secondary to caring for her mother who has progressive dementia. I advised that we can try buspirone 7.5 mg twice daily as needed for anxiety, she reports she does not want to take anything daily, I advised that we could try it as needed to see if it would work.

## 2023-02-20 NOTE — Assessment & Plan Note (Signed)
Current hypertension medications:       Sig   atenolol (TENORMIN) 50 MG tablet (Taking) TAKE 1 TABLET DAILY   lisinopril (ZESTRIL) 20 MG tablet (Taking) TAKE 1 TABLET DAILY (NEED APPOINTMENT)     BP is well controlled today on the above medications, will continue as prescribed.

## 2023-02-25 ENCOUNTER — Other Ambulatory Visit: Payer: Self-pay | Admitting: Family Medicine

## 2023-02-25 DIAGNOSIS — F419 Anxiety disorder, unspecified: Secondary | ICD-10-CM

## 2023-03-19 ENCOUNTER — Other Ambulatory Visit: Payer: Self-pay | Admitting: Obstetrics and Gynecology

## 2023-03-19 ENCOUNTER — Other Ambulatory Visit: Payer: Self-pay

## 2023-03-19 DIAGNOSIS — Z1231 Encounter for screening mammogram for malignant neoplasm of breast: Secondary | ICD-10-CM

## 2023-03-26 ENCOUNTER — Ambulatory Visit: Payer: BC Managed Care – PPO

## 2023-04-03 ENCOUNTER — Ambulatory Visit
Admission: RE | Admit: 2023-04-03 | Discharge: 2023-04-03 | Disposition: A | Discharge status: Home / Self Care | Payer: BC Managed Care – PPO | Source: Ambulatory Visit | Attending: Obstetrics and Gynecology | Admitting: Obstetrics and Gynecology

## 2023-04-03 DIAGNOSIS — Z1231 Encounter for screening mammogram for malignant neoplasm of breast: Secondary | ICD-10-CM

## 2023-05-01 ENCOUNTER — Ambulatory Visit: Payer: BC Managed Care – PPO | Admitting: Family Medicine

## 2023-05-01 ENCOUNTER — Encounter: Payer: Self-pay | Admitting: Family Medicine

## 2023-05-01 VITALS — BP 140/80 | HR 64 | Temp 97.9°F | Ht 61.0 in | Wt 193.2 lb

## 2023-05-01 DIAGNOSIS — Z23 Encounter for immunization: Secondary | ICD-10-CM

## 2023-05-01 DIAGNOSIS — I10 Essential (primary) hypertension: Secondary | ICD-10-CM

## 2023-05-01 DIAGNOSIS — K219 Gastro-esophageal reflux disease without esophagitis: Secondary | ICD-10-CM | POA: Diagnosis not present

## 2023-05-01 DIAGNOSIS — Z0000 Encounter for general adult medical examination without abnormal findings: Secondary | ICD-10-CM

## 2023-05-01 MED ORDER — LISINOPRIL 40 MG PO TABS
40.0000 mg | ORAL_TABLET | Freq: Every day | ORAL | 1 refills | Status: DC
Start: 2023-05-01 — End: 2024-05-19

## 2023-05-01 NOTE — Progress Notes (Signed)
Complete physical exam  Patient: Angelica Wells   DOB: Jan 27, 1961   62 y.o. Female  MRN: [id number]  Subjective:    Chief Complaint  Patient presents with   Annual Exam    Angelica Wells is a 62 y.o. female who presents today for a complete physical exam. She reports consuming a general diet. The patient does not participate in regular exercise at present. She generally feels well. She reports sleeping fairly well. She does not have additional problems to discuss today.    Most recent fall risk assessment:     No data to display           Most recent depression screenings:    02/19/2023    8:34 AM  PHQ 2/9 Scores  PHQ - 2 Score 2  PHQ- 9 Score 6    Vision:Within last year and wears glasses and Dental: No current dental problems and Receives regular dental care  Patient Active Problem List   Diagnosis Date Noted   Anxiety 02/20/2023   Precordial pain 08/12/2019   Family history of heart disease 08/12/2019   Eosinophilic esophagitis 08/26/2014   History of colonic polyps 08/26/2014   Eczema    Abnormal urine 08/06/2011   GERD (gastroesophageal reflux disease) 06/21/2011   Exercise-induced asthma    OBESITY 05/14/2010   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   cetirizine (ZYRTEC) 10 MG tablet Take 10 mg by mouth as needed.   clobetasol cream (TEMOVATE) 0.05 % Apply topically 2 (two) times daily as needed.   estradiol (ESTRACE) 0.1 MG/GM vaginal cream USE 1 GRAM VAGINALLY 2 TIMES A WEEK AT BEDTIME   omeprazole (PRILOSEC) 40 MG capsule Take  40 mg by mouth daily.   zolmitriptan (ZOMIG) 5 MG tablet Take 5 mg by mouth as needed for migraine.   [DISCONTINUED] lisinopril (ZESTRIL) 20 MG tablet TAKE 1 TABLET DAILY (NEED APPOINTMENT)   No facility-administered medications prior to visit.    Review of Systems  HENT:  Negative for hearing loss.   Eyes:  Negative for blurred vision.  Respiratory:  Negative for shortness of breath.   Cardiovascular:  Negative for chest pain.  Gastrointestinal: Negative.   Genitourinary: Negative.   Musculoskeletal:  Negative for back pain.  Neurological:  Negative for headaches.  Psychiatric/Behavioral:  Negative for depression.   All other systems reviewed and are negative.      Objective:     BP (!) 140/80 (BP Location: Right Arm, Patient Position: Sitting, Cuff Size: Normal)   Pulse 64   Temp 97.9 F (36.6 C) (Oral)   Ht 5\' 1"  (1.549 m)   Wt 193 lb 3.2 oz (87.6 kg)   SpO2 95%   BMI 36.50 kg/m     Physical Exam Vitals reviewed.  Constitutional:      Appearance: Normal appearance. She is well-groomed. She is obese.  HENT:     Right Ear: Tympanic membrane and ear canal normal.     Left Ear: Tympanic membrane and ear canal normal.     Mouth/Throat:  Mouth: Mucous membranes are moist.     Pharynx: No posterior oropharyngeal erythema.  Eyes:     Conjunctiva/sclera: Conjunctivae normal.  Neck:     Thyroid: No thyromegaly.  Cardiovascular:     Rate and Rhythm: Normal rate and regular rhythm.     Pulses: Normal pulses.     Heart sounds: S1 normal and S2 normal.  Pulmonary:     Effort: Pulmonary effort is normal.     Breath sounds: Normal breath sounds and air entry.  Abdominal:     General: Abdomen is flat. Bowel sounds are normal.     Palpations: Abdomen is soft.  Musculoskeletal:     Right lower leg: No edema.     Left lower leg: No edema.  Lymphadenopathy:     Cervical: No cervical adenopathy.  Neurological:     Mental Status: She is alert and oriented to person,  place, and time. Mental status is at baseline.     Gait: Gait is intact.  Psychiatric:        Mood and Affect: Mood and affect normal.        Speech: Speech normal.        Behavior: Behavior normal.        Judgment: Judgment normal.      No results found for any visits on 05/01/23.      Assessment & Plan:    Routine Health Maintenance and Physical Exam  Immunization History  Administered Date(s) Administered   Influenza Whole 04/28/2009   Influenza, Seasonal, Injecte, Preservative Fre 05/01/2023   Influenza,inj,Quad PF,6+ Mos 05/03/2016   PFIZER(Purple Top)SARS-COV-2 Vaccination 09/23/2019, 10/21/2019, 05/30/2020   Td 12/29/2003   Tdap 03/15/2015    Health Maintenance  Topic Date Due   COVID-19 Vaccine (4 - 2023-24 season) 05/17/2023 (Originally 03/16/2023)   Hepatitis C Screening  02/19/2024 (Originally 03/23/1980)   HIV Screening  02/19/2024 (Originally 03/23/1977)   DTaP/Tdap/Td (3 - Td or Tdap) 03/14/2025   MAMMOGRAM  04/02/2025   Colonoscopy  05/31/2026   Cervical Cancer Screening (HPV/Pap Cotest)  09/04/2027   INFLUENZA VACCINE  Completed   Zoster Vaccines- Shingrix  Completed   HPV VACCINES  Aged Out    Discussed health benefits of physical activity, and encouraged her to engage in regular exercise appropriate for her age and condition.  Need for immunization against influenza -     Flu vaccine trivalent PF, 6mos and older(Flulaval,Afluria,Fluarix,Fluzone)  Essential hypertension -     Lisinopril; Take 1 tablet (40 mg total) by mouth daily.  Dispense: 90 tablet; Refill: 1  Gastroesophageal reflux disease, unspecified whether esophagitis present -     Vitamin B12; Future  Routine general medical examination at a health care facility  Normal physical exam findings today except her BP is borderline. I recommended increasing her lisinopril to 40 mg daily and she is agreeable, will check B12 since she is on chronic PPI for her GERD. Counseling given on stress  management, sleep hygiene, handouts given on healthy eating and exercise. Counseled patient that CDC recommends 30 minutes 5 times a week of regular exercise.   Return in 6 months (on 10/30/2023).     Karie Georges, MD

## 2023-05-01 NOTE — Patient Instructions (Signed)

## 2023-07-11 ENCOUNTER — Other Ambulatory Visit (HOSPITAL_BASED_OUTPATIENT_CLINIC_OR_DEPARTMENT_OTHER): Payer: Self-pay | Admitting: Cardiology

## 2023-07-11 DIAGNOSIS — I10 Essential (primary) hypertension: Secondary | ICD-10-CM

## 2023-07-22 ENCOUNTER — Encounter: Payer: Self-pay | Admitting: Family Medicine

## 2023-07-22 DIAGNOSIS — K219 Gastro-esophageal reflux disease without esophagitis: Secondary | ICD-10-CM

## 2023-07-22 DIAGNOSIS — G43C0 Periodic headache syndromes in child or adult, not intractable: Secondary | ICD-10-CM

## 2023-07-22 MED ORDER — ZOLMITRIPTAN 5 MG PO TABS
5.0000 mg | ORAL_TABLET | ORAL | 3 refills | Status: AC | PRN
Start: 2023-07-22 — End: ?

## 2023-07-22 MED ORDER — OMEPRAZOLE 40 MG PO CPDR
40.0000 mg | DELAYED_RELEASE_CAPSULE | Freq: Every day | ORAL | 1 refills | Status: AC
Start: 2023-07-22 — End: ?

## 2023-08-26 DIAGNOSIS — L578 Other skin changes due to chronic exposure to nonionizing radiation: Secondary | ICD-10-CM | POA: Diagnosis not present

## 2023-08-26 DIAGNOSIS — L821 Other seborrheic keratosis: Secondary | ICD-10-CM | POA: Diagnosis not present

## 2023-08-26 DIAGNOSIS — L814 Other melanin hyperpigmentation: Secondary | ICD-10-CM | POA: Diagnosis not present

## 2023-08-26 DIAGNOSIS — D1801 Hemangioma of skin and subcutaneous tissue: Secondary | ICD-10-CM | POA: Diagnosis not present

## 2023-09-08 ENCOUNTER — Ambulatory Visit: Payer: Self-pay | Admitting: Family Medicine

## 2023-09-08 NOTE — Telephone Encounter (Signed)
 Copied from CRM [id number]. Topic: Clinical - Red Word Triage >> Sep 08, 2023  1:11 PM Fredrica W wrote: Red Word that prompted transfer to Nurse Triage: Painful cough deep in lungs, ribs hurt and splitting headache   Chief Complaint: Cough Symptoms: Cough, headache, shortness of breath with cough  Frequency: Frequent  Pertinent Negatives: Patient denies fever Disposition: [] ED /[] Urgent Care (no appt availability in office) / [x] Appointment(In office/virtual)/ []  [facility]/ [] Home Care/ [] Refused Recommended Disposition /[] [facility]/ []  Follow-up with PCP Additional Notes: Patient reports a deep cough that began 3 days ago. She states that with her cough she is also experiencing some shortness of breath when coughing and a headache. Patient states she has had a similar cough with pneumonia in the past and would like to be evaluated. Appointment made for the patient tomorrow.     Reason for Disposition  [1] Continuous (nonstop) coughing interferes with work or school AND [2] no improvement using cough treatment per Care Advice  Answer Assessment - Initial Assessment Questions 1. ONSET: "When did the cough begin?"      3 days ago  2. SEVERITY: "How bad is the cough today?"      Moderate  3. SPUTUM: "Describe the color of your sputum" (none, dry cough; clear, white, yellow, green)     Minor 4. HEMOPTYSIS: "Are you coughing up any blood?" If so ask: "How much?" (flecks, streaks, tablespoons, etc.)     No 5. DIFFICULTY BREATHING: "Are you having difficulty breathing?" If Yes, ask: "How bad is it?" (e.g., mild, moderate, severe)    - MILD: No SOB at rest, mild SOB with walking, speaks normally in sentences, can lie down, no retractions, pulse < 100.    - MODERATE: SOB at rest, SOB with minimal exertion and prefers to sit, cannot lie down flat, speaks in phrases, mild retractions, audible wheezing, pulse 100-120.    - SEVERE: Very SOB at rest, speaks in single words,  struggling to breathe, sitting hunched forward, retractions, pulse > 120      Shortness of breath with cough  6. FEVER: "Do you have a fever?" If Yes, ask: "What is your temperature, how was it measured, and when did it start?"     No 7. CARDIAC HISTORY: "Do you have any history of heart disease?" (e.g., heart attack, congestive heart failure)      No 8. LUNG HISTORY: "Do you have any history of lung disease?"  (e.g., pulmonary embolus, asthma, emphysema)     No 9. PE RISK FACTORS: "Do you have a history of blood clots?" (or: recent major surgery, recent prolonged travel, bedridden)     No 10. OTHER SYMPTOMS: "Do you have any other symptoms?" (e.g., runny nose, wheezing, chest pain)       Headache  11. PREGNANCY: "Is there any chance you are pregnant?" "When was your last menstrual period?"       No 12. TRAVEL: "Have you traveled out of the country in the last month?" (e.g., travel history, exposures)       No  Protocols used: Cough - Acute Non-Productive-A-AH

## 2023-09-08 NOTE — Telephone Encounter (Signed)
 Patient has an appt with Dr Clent Ridges on 2/25.

## 2023-09-08 NOTE — Telephone Encounter (Signed)
 Noted- ok to close.

## 2023-09-09 ENCOUNTER — Encounter: Payer: Self-pay | Admitting: Family Medicine

## 2023-09-09 ENCOUNTER — Ambulatory Visit (INDEPENDENT_AMBULATORY_CARE_PROVIDER_SITE_OTHER): Payer: BC Managed Care – PPO | Admitting: Family Medicine

## 2023-09-09 ENCOUNTER — Telehealth: Payer: Self-pay

## 2023-09-09 VITALS — BP 122/78 | HR 63 | Temp 98.1°F | Wt 197.0 lb

## 2023-09-09 DIAGNOSIS — J40 Bronchitis, not specified as acute or chronic: Secondary | ICD-10-CM | POA: Diagnosis not present

## 2023-09-09 MED ORDER — AZITHROMYCIN 250 MG PO TABS: ORAL_TABLET | ORAL | 0 refills | Status: DC

## 2023-09-09 MED ORDER — HYDROCODONE BIT-HOMATROP MBR 5-1.5 MG/5ML PO SOLN: 5.0000 mL | ORAL | 0 refills | Status: DC | PRN

## 2023-09-09 NOTE — Telephone Encounter (Signed)
 Copied from CRM [id number]. Topic: Clinical - Prescription Issue >> Sep 09, 2023  3:24 PM Orinda Kenner C wrote: Reason for CRM: Patient states [facility] told patient HYDROcodone bit-homatropine (HYCODAN) 5-1.5 MG/5ML syrup is back ordered. Can the medication be sent to [id number] - [place], [place] - [address] [id number] [phone] Please let patient when this is done, call [phone].

## 2023-09-09 NOTE — Progress Notes (Signed)
   Subjective:    Patient ID: Angelica Wells, female    DOB: November 09, 1960, 63 y.o.   MRN: [id number]  HPI Here for 5 days of chest congestion, dry cough, and wheezing. No SOB or fever. Drinking fluids.    Review of Systems  Constitutional: Negative.   HENT:  Negative for congestion, ear pain, postnasal drip, sinus pressure and sore throat.   Eyes: Negative.   Respiratory:  Positive for cough and wheezing. Negative for shortness of breath.   Gastrointestinal: Negative.        Objective:   Physical Exam Constitutional:      Appearance: Normal appearance.  HENT:     Right Ear: Tympanic membrane, ear canal and external ear normal.     Left Ear: Tympanic membrane, ear canal and external ear normal.     Nose: Nose normal.     Mouth/Throat:     Pharynx: Oropharynx is clear.  Eyes:     Conjunctiva/sclera: Conjunctivae normal.  Pulmonary:     Effort: Pulmonary effort is normal.     Breath sounds: Wheezing present. No rhonchi or rales.  Lymphadenopathy:     Cervical: No cervical adenopathy.  Neurological:     Mental Status: She is alert.           Assessment & Plan:  Bronchitis, treat with a Zpack.  Gershon Crane, MD

## 2023-09-09 NOTE — Addendum Note (Signed)
 Addended by: Gershon Crane A on: 09/09/2023 05:24 PM   Modules accepted: Orders

## 2023-09-09 NOTE — Telephone Encounter (Signed)
I sent it to [facility].

## 2023-12-18 ENCOUNTER — Encounter: Payer: Self-pay | Admitting: Obstetrics and Gynecology

## 2023-12-18 ENCOUNTER — Ambulatory Visit: Payer: Self-pay | Admitting: Obstetrics and Gynecology

## 2023-12-18 VITALS — BP 120/66 | HR 65 | Temp 97.8°F | Ht 62.0 in | Wt 199.0 lb

## 2023-12-18 DIAGNOSIS — Z01419 Encounter for gynecological examination (general) (routine) without abnormal findings: Secondary | ICD-10-CM

## 2023-12-18 DIAGNOSIS — N318 Other neuromuscular dysfunction of bladder: Secondary | ICD-10-CM | POA: Diagnosis not present

## 2023-12-18 DIAGNOSIS — N958 Other specified menopausal and perimenopausal disorders: Secondary | ICD-10-CM

## 2023-12-18 DIAGNOSIS — Z1331 Encounter for screening for depression: Secondary | ICD-10-CM

## 2023-12-18 MED ORDER — VIBEGRON 75 MG PO TABS
1.0000 | ORAL_TABLET | Freq: Every day | ORAL | 3 refills | Status: DC
Start: 2023-12-18 — End: 2023-12-18

## 2023-12-18 MED ORDER — VIBEGRON 75 MG PO TABS
1.0000 | ORAL_TABLET | Freq: Every day | ORAL | 3 refills | Status: DC
Start: 2023-12-18 — End: 2023-12-23

## 2023-12-18 MED ORDER — PREMARIN 0.625 MG/GM VA CREA
TOPICAL_CREAM | VAGINAL | 3 refills | Status: DC
Start: 2023-12-18 — End: 2023-12-18

## 2023-12-18 MED ORDER — PREMARIN 0.625 MG/GM VA CREA
TOPICAL_CREAM | VAGINAL | 3 refills | Status: DC
Start: 2023-12-18 — End: 2023-12-23

## 2023-12-18 NOTE — Progress Notes (Signed)
 63 y.o. [id number] postmenopausal female with GSM, OAB (on ditropan ) here for annual exam. Married. 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  H/O multiple small fibroids, myomectomy. H/O endometrial ablation.   She reports vaginal dryness.  Does not feel that vaginal estrogen is working. Minimal symptom relief from Ditropan .    Abnormal bleeding: none Pelvic discharge or pain: none Breast mass, nipple discharge or skin changes : none  Sexually active: no  Last PAP:     Component Value Date/Time   DIAGPAP  09/03/2022 1606    - Negative for intraepithelial lesion or malignancy (NILM)   DIAGPAP  10/20/2017 0000    NEGATIVE FOR INTRAEPITHELIAL LESIONS OR MALIGNANCY.   HPVHIGH Negative 09/03/2022 1606   ADEQPAP  09/03/2022 1606    Satisfactory for evaluation; transformation zone component ABSENT.   ADEQPAP  10/20/2017 0000    Satisfactory for evaluation  endocervical/transformation zone component PRESENT.   Last mammogram: 04/03/23 BI-RADS 1, density I Last colonoscopy: 05/31/21 q27yrs  Exercising: no Smoker: no  Garment/textile technologist Visit from 12/18/2023 in [facility] [place]  PHQ-2 Total Score 2    Flowsheet Row Office Visit from 12/18/2023 in [facility]  PHQ-9 Total Score 4     GYN HISTORY: H/O multiple small fibroids, myomectomy.  H/O endometrial ablation.   OB History  Gravida Para Term Preterm AB Living  2 2 2   2   SAB IAB Ectopic Multiple Live Births      2    # Outcome Date GA Lbr Len/2nd Weight Sex Type Anes PTL Lv  2 Term 12    F CS-Unspec   LIV  1 Term 78    M CS-Unspec   LIV   Past Medical History:  Diagnosis Date   Aortic regurgitation    Eczema    Eosinophilic esophagitis 12/15   Esophageal stricture    Exercise-induced asthma    GERD (gastroesophageal reflux disease)    History of uterine fibroid    [id number] cm    Hypertension    Migraine without aura 05/2005   Past Surgical  History:  Procedure Laterality Date   ABDOMINOPLASTY  2000   ABLATION  01/06/2004   HTA   BREAST REDUCTION SURGERY     CESAREAN SECTION  1996, 1998   MYOMECTOMY  1995   TONSILLECTOMY     uterine ablation     Current Outpatient Medications on File Prior to Visit  Medication Sig Dispense Refill   atenolol  (TENORMIN ) 50 MG tablet TAKE 1 TABLET DAILY 90 tablet 1   busPIRone  (BUSPAR ) 7.5 MG tablet TAKE 1 TABLET BY MOUTH 2 TIMES DAILY AS NEEDED. 180 tablet 0   clobetasol  cream (TEMOVATE ) 0.05 % Apply topically 2 (two) times daily as needed. 45 g 1   lisinopril  (ZESTRIL ) 20 MG tablet Take 20 mg by mouth.     lisinopril  (ZESTRIL ) 40 MG tablet Take 1 tablet (40 mg total) by mouth daily. 90 tablet 1   omeprazole  (PRILOSEC) 40 MG capsule Take 1 capsule (40 mg total) by mouth daily. 90 capsule 1   zolmitriptan  (ZOMIG ) 5 MG tablet Take 1 tablet (5 mg total) by mouth as needed for migraine. 18 tablet 3   No current facility-administered medications on file prior to visit.   Social History   Socioeconomic History   Marital status: Married    Spouse name: Not on file   Number of children: Not on file   Years of education:  Not on file   Highest education level: Not on file  Occupational History   Not on file  Tobacco Use   Smoking status: Never   Smokeless tobacco: Never  Vaping Use   Vaping status: Never Used  Substance and Sexual Activity   Alcohol use: Yes    Alcohol/week: 2.0 standard drinks of alcohol    Types: 2 Standard drinks or equivalent per week   Drug use: No   Sexual activity: Not Currently    Partners: Male    Birth control/protection: Post-menopausal  Other Topics Concern   Not on file  Social History Narrative   Married   Regular exercise- no   2 caffeine   HH of 4   No pet   BS degree   Work in [employer]   Social Drivers of Health   Financial Resource Strain: Low Risk  (02/25/2021)   Received from [facility]   Overall Financial  Resource Strain (CARDIA)    Difficulty of Paying Living Expenses: Not hard at all  Food Insecurity: No Food Insecurity (02/25/2021)   Received from [facility]   Hunger Vital Sign    Within the past 12 months, you worried that your food would run out before you got the money to buy more.: Never true    Within the past 12 months, the food you bought just didn't last and you didn't have money to get more.: Never true  Transportation Needs: No Transportation Needs (02/25/2021)   Received from [facility] - Transportation    Lack of Transportation (Medical): No    Lack of Transportation (Non-Medical): No  Physical Activity: Insufficiently Active (02/25/2021)   Received from [facility]   Exercise Vital Sign    On average, how many days per week do you engage in moderate to strenuous exercise (like a brisk walk)?: 1 day    On average, how many minutes do you engage in exercise at this level?: 10 min  Stress: Stress Concern Present (02/25/2021)   Received from [facility] - Occupational Stress Questionnaire    Feeling of Stress : To some extent  Social Connections: Unknown (11/26/2021)   Received from [facility]   Social Network    Social Network: Not on file  Intimate Partner Violence: Unknown (10/18/2021)   Received from [facility]   HITS    Physically Hurt: Not on file    Insult or Talk Down To: Not on file    Threaten Physical Harm: Not on file    Scream or Curse: Not on file   Family History  Problem Relation Age of Onset   Hypertension Mother    Other Mother        mvp, bicuspid aortic valve cabg, valve replacement   Migraines Mother        went away after menopause   Bladder Cancer Father    Hypertension Father    Breast cancer Maternal Grandmother 9   Allergies  Allergen Reactions   Sulfonamide Derivatives Other (See Comments)    Made feel out of control like couldn't function     PE Today's Vitals    12/18/23 1113  BP: 120/66  Pulse: 65  Temp: 97.8 F (36.6 C)  TempSrc: Oral  SpO2: 97%  Weight: 199 lb (90.3 kg)  Height: 5' 2 (1.575 m)   Body mass index is 36.4 kg/m.  Physical Exam Vitals reviewed. Exam conducted with a chaperone present.  Constitutional:      General: She is not in acute distress.    Appearance: Normal appearance.  HENT:     Head: Normocephalic and atraumatic.     Nose: Nose normal.   Eyes:     Extraocular Movements: Extraocular movements intact.     Conjunctiva/sclera: Conjunctivae normal.   Neck:     Thyroid : No thyroid  mass, thyromegaly or thyroid  tenderness.  Pulmonary:     Effort: Pulmonary effort is normal.  Chest:     Chest wall: No mass or tenderness.  Breasts:    Right: Normal. No swelling, mass, nipple discharge, skin change or tenderness.     Left: Normal. No swelling, mass, nipple discharge, skin change or tenderness.     Comments: Bilateral breast reduction scars Abdominal:     General: There is no distension.     Palpations: Abdomen is soft.     Tenderness: There is no abdominal tenderness.  Genitourinary:    General: Normal vulva.     Exam position: Lithotomy position.     Urethra: No prolapse.     Vagina: Normal. No vaginal discharge or bleeding.     Cervix: Normal. No lesion.     Uterus: Normal. Not enlarged and not tender.      Adnexa: Right adnexa normal and left adnexa normal.   Musculoskeletal:        General: Normal range of motion.     Cervical back: Normal range of motion.  Lymphadenopathy:     Upper Body:     Right upper body: No axillary adenopathy.     Left upper body: No axillary adenopathy.     Lower Body: No right inguinal adenopathy. No left inguinal adenopathy.   Skin:    General: Skin is warm and dry.   Neurological:     General: No focal deficit present.     Mental Status: She is alert.   Psychiatric:        Mood and Affect: Mood normal.        Behavior: Behavior normal.       Assessment and  Plan:        Well woman exam with routine gynecological exam Assessment & Plan: Cervical cancer screening performed according to ASCCP guidelines. Encouraged annual mammogram screening Colonoscopy UTD DXA: due at 63yo Labs and immunizations with her primary Encouraged safe sexual practices as indicated Encouraged healthy lifestyle practices with diet and exercise For patients under 50-70yo, I recommend 1200mg  calcium daily and 600IU of vitamin D daily.    Genitourinary syndrome of menopause Assessment & Plan: Switch to premarin , can also consider intrarosa Pt in agreement   OVERACTIVE BLADDER Assessment & Plan: Poorly controlled with oxybutynin , will switch to gemtesa     Romaine Closs, MD

## 2023-12-18 NOTE — Patient Instructions (Signed)

## 2023-12-23 MED ORDER — PREMARIN 0.625 MG/GM VA CREA
TOPICAL_CREAM | VAGINAL | 1 refills | Status: AC
Start: 2023-12-23 — End: ?

## 2023-12-23 MED ORDER — VIBEGRON 75 MG PO TABS
1.0000 | ORAL_TABLET | Freq: Every day | ORAL | 3 refills | Status: DC
Start: 2023-12-23 — End: 2024-05-28

## 2023-12-29 MED ORDER — PREMARIN 0.625 MG/GM VA CREA: TOPICAL_CREAM | VAGINAL | 1 refills | Status: DC

## 2023-12-29 NOTE — Addendum Note (Signed)
 Addended by: Yale Held on: 12/29/2023 11:14 AM   Modules accepted: Orders

## 2023-12-29 NOTE — Telephone Encounter (Signed)
 Per review of EPIC, previous Premarin  Rx printed per patients request.  Call placed to patient, advised as seen above. Patient states she is unable to pick up Rx, [facility]   Rx sent.   Advised patient she can transfer to pharmacy of her choice if she decides not to fill at [facility].   Patient will provide update.  Routing FYI.

## 2023-12-31 DIAGNOSIS — Z01419 Encounter for gynecological examination (general) (routine) without abnormal findings: Secondary | ICD-10-CM | POA: Insufficient documentation

## 2023-12-31 DIAGNOSIS — N958 Other specified menopausal and perimenopausal disorders: Secondary | ICD-10-CM | POA: Insufficient documentation

## 2023-12-31 NOTE — Assessment & Plan Note (Signed)
 Switch to premarin , can also consider intrarosa Pt in agreement

## 2023-12-31 NOTE — Assessment & Plan Note (Signed)
 Poorly controlled with oxybutynin , will switch to gemtesa 

## 2023-12-31 NOTE — Assessment & Plan Note (Signed)
 Cervical cancer screening performed according to ASCCP guidelines. Encouraged annual mammogram screening Colonoscopy UTD DXA: due at 63yo Labs and immunizations with her primary Encouraged safe sexual practices as indicated Encouraged healthy lifestyle practices with diet and exercise For patients under 50-70yo, I recommend 1200mg  calcium daily and 600IU of vitamin D daily.

## 2024-01-08 NOTE — Addendum Note (Signed)
 Addended by: BRUTUS KATE SAILOR on: 01/08/2024 11:21 AM   Modules accepted: Orders

## 2024-01-15 ENCOUNTER — Other Ambulatory Visit (HOSPITAL_BASED_OUTPATIENT_CLINIC_OR_DEPARTMENT_OTHER): Payer: Self-pay | Admitting: Cardiology

## 2024-01-22 ENCOUNTER — Telehealth: Payer: Self-pay

## 2024-01-22 DIAGNOSIS — N958 Other specified menopausal and perimenopausal disorders: Secondary | ICD-10-CM

## 2024-01-22 MED ORDER — ESTRADIOL 0.1 MG/GM VA CREA
TOPICAL_CREAM | VAGINAL | 1 refills | Status: AC
Start: 2024-01-22 — End: ?

## 2024-01-22 NOTE — Telephone Encounter (Signed)
 Med refill request: estradiol  vaginal cream 42.5 GM 3 month supply Last AEX: 12/18/23 GH Next AEX: not yet scheduled Last MMG (if hormonal med) 04/03/23 Refill authorized: Pt is requesting refill sent to [facility]

## 2024-03-07 ENCOUNTER — Encounter: Payer: Self-pay | Admitting: Family Medicine

## 2024-03-07 DIAGNOSIS — I10 Essential (primary) hypertension: Secondary | ICD-10-CM

## 2024-03-10 MED ORDER — ATENOLOL 50 MG PO TABS
50.0000 mg | ORAL_TABLET | Freq: Every day | ORAL | 0 refills | Status: DC
Start: 2024-03-10 — End: 2024-05-19

## 2024-04-14 ENCOUNTER — Other Ambulatory Visit: Payer: Self-pay | Admitting: Obstetrics and Gynecology

## 2024-04-14 DIAGNOSIS — Z1231 Encounter for screening mammogram for malignant neoplasm of breast: Secondary | ICD-10-CM

## 2024-04-16 ENCOUNTER — Ambulatory Visit
Admission: RE | Admit: 2024-04-16 | Discharge: 2024-04-16 | Disposition: A | Discharge status: Home / Self Care | Source: Ambulatory Visit | Attending: Obstetrics and Gynecology | Admitting: Obstetrics and Gynecology

## 2024-04-16 DIAGNOSIS — Z1231 Encounter for screening mammogram for malignant neoplasm of breast: Secondary | ICD-10-CM

## 2024-04-21 ENCOUNTER — Ambulatory Visit: Payer: Self-pay | Admitting: Obstetrics and Gynecology

## 2024-05-14 LAB — LAB REPORT - SCANNED
A1c: 5.4
Albumin, Urine POC: 0.1
Albumin/Creatinine Ratio, Urine, POC: 8.3
Creatinine, POC: 12 mg/dL
HM Hepatitis Screen: NEGATIVE

## 2024-05-17 ENCOUNTER — Telehealth: Payer: Self-pay | Admitting: Cardiology

## 2024-05-17 NOTE — Telephone Encounter (Signed)
 Patient would like to switch to providers. Please advise

## 2024-05-18 ENCOUNTER — Telehealth (HOSPITAL_BASED_OUTPATIENT_CLINIC_OR_DEPARTMENT_OTHER): Payer: Self-pay | Admitting: Cardiology

## 2024-05-18 DIAGNOSIS — I10 Essential (primary) hypertension: Secondary | ICD-10-CM

## 2024-05-18 NOTE — Telephone Encounter (Signed)
 *  STAT* If patient is at the pharmacy, call can be transferred to refill team.   1. Which medications need to be refilled? (please list name of each medication and dose if known)   atenolol  (TENORMIN ) 50 MG tablet  lisinopril  (ZESTRIL ) 40 MG tablet   2. Which pharmacy/location (including street and city if local pharmacy) is medication to be sent to?  [facility] - [place], [place] - [address]   3. Do they need a 30 day or 90 day supply? 90  Patient has appt with Dr Lonni on 08/04/24

## 2024-05-18 NOTE — Telephone Encounter (Signed)
 Call placed to pt regarding refill request.  There is 2 different strengths of Lisinopril  listed.  Last o/v with Dr. Lonni, pt was on 20 mg.  Looks like PCP sent in 40 mg.    Pt will look when she gets home and either send in a mychart message or call us  back to verify.

## 2024-05-18 NOTE — Telephone Encounter (Signed)
  Disregard provider switch. Patient decided she would like to stay with Dr Lonni. She thought she needed to switch due to her insurance but she doesn't.

## 2024-05-18 NOTE — Telephone Encounter (Signed)
I am okay

## 2024-05-19 MED ORDER — ATENOLOL 50 MG PO TABS: 50.0000 mg | ORAL_TABLET | Freq: Every day | ORAL | 0 refills | Status: DC

## 2024-05-19 MED ORDER — LISINOPRIL 20 MG PO TABS: 20.0000 mg | ORAL_TABLET | Freq: Every day | ORAL | 0 refills | Status: DC

## 2024-05-19 NOTE — Telephone Encounter (Signed)
 Patient sent message to scheduling pool  Following up with Angelica Wells from yesterday.  I have been taking 20 milligrams of lisinopril .  The 32 was an old rx and once Dr Lonni changed to 20 I was splitting any 40 I had.     Thanks.  Angelica Wells

## 2024-05-19 NOTE — Telephone Encounter (Signed)
 Medication list updated, removed Lisinopril  40 mg.  Pt scheduled to see Dr. Lonni 08/10/23, 90 day refill Lisinopril  20 mg and Atenolol  50 mg has been sent to [facility].

## 2024-05-28 ENCOUNTER — Encounter: Payer: Self-pay | Admitting: Family Medicine

## 2024-05-28 ENCOUNTER — Ambulatory Visit (INDEPENDENT_AMBULATORY_CARE_PROVIDER_SITE_OTHER): Admitting: Family Medicine

## 2024-05-28 VITALS — BP 120/80 | HR 70 | Temp 98.2°F | Ht 62.75 in | Wt 209.0 lb

## 2024-05-28 DIAGNOSIS — Z0000 Encounter for general adult medical examination without abnormal findings: Secondary | ICD-10-CM

## 2024-05-28 DIAGNOSIS — E66812 Obesity, class 2: Secondary | ICD-10-CM

## 2024-05-28 DIAGNOSIS — Z6837 Body mass index (BMI) 37.0-37.9, adult: Secondary | ICD-10-CM

## 2024-05-28 NOTE — Patient Instructions (Addendum)
 Basal energy expenditure: 1500 per day  Total carbs per day: 80 grams per day  [email or web address].[email or web address]  [email or web address]

## 2024-05-28 NOTE — Assessment & Plan Note (Signed)
I have had an extensive 30 minute conversation today with the patient about healthy eating habits, exercise, calorie and carb goals for sustainable and successful weight loss. I gave the patient caloric and protein daily intake values as well as described the importance of increasing fiber and water intake. I discussed weight loss medications that could be used in the treatment of this patient. Handouts on low carb eating were given to the patient.

## 2024-05-28 NOTE — Progress Notes (Signed)
 Complete physical exam  Patient: Angelica Wells   DOB: 02/03/1961   63 y.o. Female  MRN: [id number]  Subjective:    Chief Complaint  Patient presents with   Annual Exam    Angelica Wells is a 63 y.o. female who presents today for a complete physical exam. She reports consuming a general diet. Home exercise routine includes walking 1-2 hrs per week. She generally feels well. She reports sleeping well. She does not have additional problems to discuss today.   Patient has concerns about her weight. States that she has a very sedentary job, tries to take breaks and walk but has difficulty doing this during the day.   Most recent fall risk assessment:    12/18/2023   11:12 AM  Fall Risk   Falls in the past year? 0  Number falls in past yr: 0  Injury with Fall? 0  Risk for fall due to : No Fall Risks  Follow up Falls evaluation completed     Most recent depression screenings:    05/28/2024    3:51 PM 12/18/2023   12:01 PM  PHQ 2/9 Scores  PHQ - 2 Score 1 2  PHQ- 9 Score 2 4      Data saved with a previous flowsheet row definition    Vision:Within last year and Dental: No current dental problems and Receives regular dental care  Patient Active Problem List   Diagnosis Date Noted   Well woman exam with routine gynecological exam 12/31/2023   Genitourinary syndrome of menopause 12/31/2023   Anxiety 02/20/2023   Precordial pain 08/12/2019   Family history of heart disease 08/12/2019   Eosinophilic esophagitis 08/26/2014   History of colonic polyps 08/26/2014   Eczema    Abnormal urine 08/06/2011   GERD (gastroesophageal reflux disease) 06/21/2011   Exercise-induced asthma    OBESITY 05/14/2010   Dyshidrotic eczema 05/14/2010   Migraine headache 09/09/2007   Essential hypertension 09/09/2007   OVERACTIVE BLADDER 09/09/2007      Patient Care Team: Ozell Heron HERO, MD as PCP - General (Family Medicine) Lonni Slain, MD as PCP - Cardiology (Cardiology)    Outpatient Medications Prior to Visit  Medication Sig   atenolol  (TENORMIN ) 50 MG tablet Take 1 tablet (50 mg total) by mouth daily.   busPIRone  (BUSPAR ) 7.5 MG tablet TAKE 1 TABLET BY MOUTH 2 TIMES DAILY AS NEEDED.   clobetasol  cream (TEMOVATE ) 0.05 % Apply topically 2 (two) times daily as needed.   estradiol  (ESTRACE  VAGINAL) 0.1 MG/GM vaginal cream Apply 1/2 gram to vulva nightly for 2 weeks then decrease to 1/2 gram to vulva two nights a week.   lisinopril  (ZESTRIL ) 20 MG tablet Take 1 tablet (20 mg total) by mouth daily.   omeprazole  (PRILOSEC) 40 MG capsule Take 1 capsule (40 mg total) by mouth daily.   zolmitriptan  (ZOMIG ) 5 MG tablet Take 1 tablet (5 mg total) by mouth as needed for migraine.   [DISCONTINUED] Vibegron  75 MG TABS Take 1 tablet (75 mg total) by mouth daily.   No facility-administered medications prior to visit.    Review of Systems  HENT:  Negative for hearing loss.   Eyes:  Negative for blurred vision.  Respiratory:  Negative for shortness of breath.   Cardiovascular:  Negative for chest pain.  Gastrointestinal: Negative.   Genitourinary: Negative.   Musculoskeletal:  Negative for back pain.  Neurological:  Negative for headaches.  Psychiatric/Behavioral:  Negative for depression.  Objective:     BP 120/80   Pulse 70   Temp 98.2 F (36.8 C) (Oral)   Ht 5' 2.75 (1.594 m)   Wt 209 lb (94.8 kg)   SpO2 100%   BMI 37.32 kg/m     Physical Exam Vitals reviewed.  Constitutional:      Appearance: Normal appearance. She is well-groomed. She is obese.  HENT:     Right Ear: Tympanic membrane and ear canal normal.     Left Ear: Tympanic membrane and ear canal normal.     Mouth/Throat:     Mouth: Mucous membranes are moist.     Pharynx: No posterior oropharyngeal erythema.  Eyes:     Conjunctiva/sclera: Conjunctivae normal.  Neck:     Thyroid : No thyromegaly.  Cardiovascular:     Rate and Rhythm: Normal rate and regular rhythm.     Pulses:  Normal pulses.     Heart sounds: S1 normal and S2 normal.  Pulmonary:     Effort: Pulmonary effort is normal.     Breath sounds: Normal breath sounds and air entry.  Abdominal:     General: Abdomen is flat. Bowel sounds are normal.     Palpations: Abdomen is soft.  Musculoskeletal:     Right lower leg: No edema.     Left lower leg: No edema.  Lymphadenopathy:     Cervical: No cervical adenopathy.  Neurological:     Mental Status: She is alert and oriented to person, place, and time. Mental status is at baseline.     Gait: Gait is intact.  Psychiatric:        Mood and Affect: Mood and affect normal.        Speech: Speech normal.        Behavior: Behavior normal.        Judgment: Judgment normal.      No results found for any visits on 05/28/24.      Assessment & Plan:    Routine Health Maintenance and Physical Exam  Immunization History  Administered Date(s) Administered   Influenza Inj Mdck Quad Pf 05/01/2022   Influenza Whole 04/28/2009   Influenza, Quadrivalent, Recombinant, Inj, Pf 04/30/2020   Influenza, Seasonal, Injecte, Preservative Fre 05/01/2023   Influenza,inj,Quad PF,6+ Mos 05/03/2016   Influenza-Unspecified 05/03/2016, 05/11/2024   PFIZER(Purple Top)SARS-COV-2 Vaccination 09/23/2019, 10/21/2019, 05/30/2020   Td 12/29/2003   Tdap 03/15/2015   Zoster Recombinant(Shingrix) 11/17/2020, 02/22/2021, 11/16/2023, 02/06/2024    Health Maintenance  Topic Date Due   Pneumococcal Vaccine: 50+ Years (1 of 2 - PCV) Never done   COVID-19 Vaccine (4 - 2025-26 season) 03/15/2024   Hepatitis C Screening  05/28/2025 (Originally [birth year]1980)   HIV Screening  05/28/2025 (Originally 03/23/1977)   DTaP/Tdap/Td (3 - Td or Tdap) 03/14/2025   Mammogram  04/16/2026   Colonoscopy  05/31/2026   Cervical Cancer Screening (HPV/Pap Cotest)  09/04/2027   Influenza Vaccine  Completed   Zoster Vaccines- Shingrix  Completed   Hepatitis B Vaccines 19-59 Average Risk  Aged Out   HPV  VACCINES  Aged Out   Meningococcal B Vaccine  Aged Out    Discussed health benefits of physical activity, and encouraged her to engage in regular exercise appropriate for her age and condition.  Routine general medical examination at a health care facility  Class 2 severe obesity with serious comorbidity and body mass index (BMI) of 37.0 to 37.9 in adult, unspecified obesity type Assessment & Plan: I have had an extensive 30 minute  conversation today with the patient about healthy eating habits, exercise, calorie and carb goals for sustainable and successful weight loss. I gave the patient caloric and protein daily intake values as well as described the importance of increasing fiber and water intake. I discussed weight loss medications that could be used in the treatment of this patient. Handouts on low carb eating were given to the patient.     General physical exam findings are normal today. I reviewed the patient's preventative testing, immunizations, and lifestyle habits. I made appropriate recommendations and placed orders for the appropriate tests and/or vaccinations. I counseled the patient on the CDC's recommendations for healthy exercise and diet. I counseled the patient on healthy sleep habits and stress management. Handouts to reinforce the counseling were given at the conclusion of the visit.    Return in about 1 year (around 05/28/2025) for annual physical exam.     Heron CHRISTELLA Sharper, MD

## 2024-07-01 ENCOUNTER — Encounter: Payer: Self-pay | Admitting: Obstetrics and Gynecology

## 2024-07-01 NOTE — Telephone Encounter (Signed)
 Medication refill request: estrace  vaginal cream Last AEX:  12-18-23 Next AEX: not scheduled Last MMG (if hormonal medication request): 04-16-24 birads 1:neg Refill authorized: rx last sent 7/25 with 1 refill. Please approve if appropriate

## 2024-08-04 ENCOUNTER — Encounter (HOSPITAL_BASED_OUTPATIENT_CLINIC_OR_DEPARTMENT_OTHER): Payer: Self-pay | Admitting: Cardiology

## 2024-08-04 ENCOUNTER — Ambulatory Visit (INDEPENDENT_AMBULATORY_CARE_PROVIDER_SITE_OTHER): Admitting: Cardiology

## 2024-08-04 VITALS — BP 122/74 | HR 70 | Ht 62.75 in | Wt 205.0 lb

## 2024-08-04 DIAGNOSIS — Z7189 Other specified counseling: Secondary | ICD-10-CM | POA: Diagnosis not present

## 2024-08-04 DIAGNOSIS — I10 Essential (primary) hypertension: Secondary | ICD-10-CM | POA: Diagnosis not present

## 2024-08-04 DIAGNOSIS — E66812 Obesity, class 2: Secondary | ICD-10-CM | POA: Diagnosis not present

## 2024-08-04 DIAGNOSIS — R002 Palpitations: Secondary | ICD-10-CM | POA: Diagnosis not present

## 2024-08-04 DIAGNOSIS — Z6836 Body mass index (BMI) 36.0-36.9, adult: Secondary | ICD-10-CM

## 2024-08-04 MED ORDER — ATENOLOL 50 MG PO TABS: 50.0000 mg | ORAL_TABLET | Freq: Every day | ORAL | 3 refills | Status: AC

## 2024-08-04 MED ORDER — LISINOPRIL 20 MG PO TABS: 20.0000 mg | ORAL_TABLET | Freq: Every day | ORAL | 3 refills | Status: AC

## 2024-08-04 NOTE — Progress Notes (Signed)
 " Cardiology Office Note:  .   Date:  08/04/2024  ID:  Angelica Wells, DOB 08/16/1960, MRN [id number] PCP: Ozell Heron HERO, MD  [facility] Providers Cardiologist:  Shelda Bruckner, MD {  History of Present Illness: .   Angelica Wells is a 64 y.o. female with a hx of chest pain and palpitations seen for follow up today. I initially met her 08/12/2019 for chest pain and palpitations.   Today: Overall doing well. She is always worried about her heart and kidneys, wants to know how she knows these are ok.  Reviewed her labs from her recent life insurance exam, lipids show Tchol 187, HDL 46, LDL 114, TG 134  Rare palpitations. No chest pain. Shortness of breath with exertion that she attributes to deconditioning. She is going to be starting GLP through compounding, hoping out of pocket with be about $300/month. She is looking into tirzepatide. Discussed pros/cons of compounding vs. [employer] direct. She is going to look into the [employer] direct.   ROS: Denies chest pain, shortness of breath at rest or with normal exertion. No PND, orthopnea, LE edema or unexpected weight gain. No syncope. ROS otherwise negative except as noted.   Studies Reviewed: SABRA    EKG:  EKG Interpretation Date/Time:  Wednesday August 04 2024 13:59:46 EST Ventricular Rate:  64 PR Interval:  202 QRS Duration:  78 QT Interval:  386 QTC Calculation: 398 R Axis:   21  Text Interpretation: Normal sinus rhythm Septal infarct (cited on or before 04-Aug-2024) When compared with ECG of 06-Jan-2023 08:41, No significant change was found Confirmed by Bruckner Shelda [id number]) on 08/04/2024 2:04:39 PM    Physical Exam:   VS:  BP 122/74   Pulse 70   Ht 5' 2.75 (1.594 m)   Wt 205 lb (93 kg)   SpO2 98%   BMI 36.60 kg/m    Wt Readings from Last 3 Encounters:  08/04/24 205 lb (93 kg)  05/28/24 209 lb (94.8 kg)  12/18/23 199 lb (90.3 kg)    GEN: Well nourished, well developed in no acute distress HEENT:  Normal, moist mucous membranes NECK: No JVD CARDIAC: regular rhythm, normal S1 and S2, no rubs or gallops. No murmur. VASCULAR: Radial and DP pulses 2+ bilaterally. No carotid bruits RESPIRATORY:  Clear to auscultation without rales, wheezing or rhonchi  ABDOMEN: Soft, non-tender, non-distended MUSCULOSKELETAL:  Ambulates independently SKIN: Warm and dry, no edema NEUROLOGIC:  Alert and oriented x 3. No focal neuro deficits noted. PSYCHIATRIC:  Normal affect    ASSESSMENT AND PLAN: .     Hypertension: longstanding, since her 59s. At goal today. -attempted long taper of atenolol , but BP rose. Doing well on 50 mg daily dose, continue -on lisinopril  20 mg daily, continue.   Obesity, BMI 36 -she is interested in tirzepatide. Considering compounded formulation but would like to get commercial product if covered -she will see if she qualifies for reduced copay program through her insurance -if not, she will consider [employer] program  GLP assessment: -Patient has a BMI of over 30 or over 27 with a weight related comorbidity -Current BMI 36.6 -Starting weight 205 lbs -Weight related comorbidities: hypertension -Has attempted weight loss with diet/exercise changes. Has tried diet modifications on her own. Has not been able to lose >5% of weight on their own -Patient is medically appropriate for GLP treatment for weight loss -discussed potential side effects, including GI discomfort (nausea, constipation, diarrhea). Discussed data re: pancreatitis. No known history  of multiple endocrine neoplasia.  Tirzepatide starting dose plan: -2.5 mg dose every week for 4 weeks (sample), -Then use the 5 mg dose every week for 4 weeks, -Then use the 7.5 mg dose every week for 4 weeks -Then use the 10 mg dose every week for 4 weeks, -Then use the 12.5 mg dose every week for 4 weeks, -Then use the 15 mg dose every week indefinitely  Some nausea is normal, as is decreased appetite. Instructed to call  with severe nausea or stomach pain. Instructed on small meals.  Palpitations:  stable, mild -continue atenolol   CV risk counseling and prevention -we discussed calcium score in the future if we need additional decision making assistance for managing risk/lipids -recommend heart healthy/Mediterranean diet, with whole grains, fruits, vegetable, fish, lean meats, nuts, and olive oil. Limit salt. -recommend moderate walking, 3-5 times/week for 30-50 minutes each session. Aim for at least 150 minutes.week. Goal should be pace of 3 miles/hours, or walking 1.5 miles in 30 minutes -recommend avoidance of tobacco products. Avoid excess alcohol. -ASCVD risk score: The 10-year ASCVD risk score (Arnett DK, et al., 2019) is: 5.1%   Values used to calculate the score:     Age: 65 years     Clinically relevant sex: Female     Is Non-Hispanic African American: No     Diabetic: No     Tobacco smoker: No     Systolic Blood Pressure: 122 mmHg     Is BP treated: Yes     HDL Cholesterol: 37 mg/dL     Total Cholesterol: 151 mg/dL    Dispo: 1 year or sooner as needed  Signed, Shelda Bruckner, MD   Shelda Bruckner, MD, PhD, [facility]  [facility]  [place]  Heart & Vascular at [facility] at [facility] [address], [address] [place], [place] [address] [phone]   "

## 2024-08-04 NOTE — Patient Instructions (Addendum)
 Look into insurance coverage/if you qualify for the copay card on this website: [email or web address]  If not, and you are interested in the Lilly Direct single vial option for zepbound, that can be prescribed by me or Dr. Ozell.    Medication Instructions:  No changes today *If you need a refill on your cardiac medications before your next appointment, please call your pharmacy*  Lab Work: none If you have labs (blood work) drawn today and your tests are completely normal, you will receive your results only by: MyChart Message (if you have MyChart) OR A paper copy in the mail If you have any lab test that is abnormal or we need to change your treatment, we will call you to review the results.  Testing/Procedures: none  Follow-Up: At [facility], you and your health needs are our priority.  As part of our continuing mission to provide you with exceptional heart care, our providers are all part of one team.  This team includes your primary Cardiologist (physician) and Advanced Practice Providers or APPs (Physician Assistants and Nurse Practitioners) who all work together to provide you with the care you need, when you need it.  Your next appointment:   12 month(s)  Provider:   Shelda Bruckner, MD, Rosaline Bane, NP, or Reche Finder, NP
